# Patient Record
Sex: Female | Born: 2000 | Race: Black or African American | Hispanic: No | Marital: Single | Smoking: Never smoker
Health system: Southern US, Community
[De-identification: ages and names within clinical notes are randomized; demographics above are authoritative.]

## PROBLEM LIST (undated history)

## (undated) ENCOUNTER — Emergency Department (HOSPITAL_COMMUNITY): Admission: EM | Payer: Managed Care, Other (non HMO) | Source: Home / Self Care

## (undated) DIAGNOSIS — K5900 Constipation, unspecified: Secondary | ICD-10-CM

## (undated) DIAGNOSIS — A0472 Enterocolitis due to Clostridium difficile, not specified as recurrent: Secondary | ICD-10-CM

## (undated) HISTORY — DX: Constipation, unspecified: K59.00

## (undated) HISTORY — PX: FECAL TRANSPLANT: SHX6383

## (undated) HISTORY — DX: Enterocolitis due to Clostridium difficile, not specified as recurrent: A04.72

---
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
## 2021-04-12 ENCOUNTER — Other Ambulatory Visit: Payer: Self-pay

## 2021-04-12 ENCOUNTER — Emergency Department (HOSPITAL_COMMUNITY)
Admission: EM | Admit: 2021-04-12 | Discharge: 2021-04-13 | Disposition: A | Discharge status: Home / Self Care | Payer: Managed Care, Other (non HMO) | Attending: Emergency Medicine | Admitting: Emergency Medicine

## 2021-04-12 ENCOUNTER — Encounter (HOSPITAL_COMMUNITY): Payer: Self-pay | Admitting: Emergency Medicine

## 2021-04-12 DIAGNOSIS — R197 Diarrhea, unspecified: Secondary | ICD-10-CM | POA: Insufficient documentation

## 2021-04-12 NOTE — ED Triage Notes (Signed)
Pt c/o abdominal pain and diarrhea, hx cdiff.

## 2021-04-13 ENCOUNTER — Telehealth: Payer: Self-pay | Admitting: Internal Medicine

## 2021-04-13 LAB — URINALYSIS, ROUTINE W REFLEX MICROSCOPIC
Bilirubin Urine: NEGATIVE
Glucose, UA: NEGATIVE mg/dL
Ketones, ur: NEGATIVE mg/dL
Nitrite: NEGATIVE
Protein, ur: NEGATIVE mg/dL
Specific Gravity, Urine: 1.015 (ref 1.005–1.030)
pH: 6 (ref 5.0–8.0)

## 2021-04-13 LAB — LIPASE, BLOOD: Lipase: 36 U/L (ref 11–51)

## 2021-04-13 LAB — COMPREHENSIVE METABOLIC PANEL
ALT: 72 U/L — ABNORMAL HIGH (ref 0–44)
AST: 39 U/L (ref 15–41)
Albumin: 3.3 g/dL — ABNORMAL LOW (ref 3.5–5.0)
Alkaline Phosphatase: 53 U/L (ref 38–126)
Anion gap: 8 (ref 5–15)
BUN: 8 mg/dL (ref 6–20)
CO2: 23 mmol/L (ref 22–32)
Calcium: 8.7 mg/dL — ABNORMAL LOW (ref 8.9–10.3)
Chloride: 106 mmol/L (ref 98–111)
Creatinine, Ser: 1.19 mg/dL — ABNORMAL HIGH (ref 0.44–1.00)
GFR, Estimated: >60 mL/min (ref >60)
Glucose, Bld: 134 mg/dL — ABNORMAL HIGH (ref 70–99)
Potassium: 3.8 mmol/L (ref 3.5–5.1)
Sodium: 137 mmol/L (ref 135–145)
Total Bilirubin: 0.3 mg/dL (ref 0.3–1.2)
Total Protein: 6.9 g/dL (ref 6.5–8.1)

## 2021-04-13 LAB — URINALYSIS, MICROSCOPIC (REFLEX)
Bacteria, UA: NONE SEEN
RBC / HPF: >50 RBC/hpf (ref 0–5)

## 2021-04-13 LAB — CBC
HCT: 35.9 % — ABNORMAL LOW (ref 36.0–46.0)
Hemoglobin: 12 g/dL (ref 12.0–15.0)
MCH: 29.6 pg (ref 26.0–34.0)
MCHC: 33.4 g/dL (ref 30.0–36.0)
MCV: 88.4 fL (ref 80.0–100.0)
Platelets: 300 10*3/uL (ref 150–400)
RBC: 4.06 MIL/uL (ref 3.87–5.11)
RDW: 13.8 % (ref 11.5–15.5)
WBC: 8 10*3/uL (ref 4.0–10.5)
nRBC: 0 % (ref 0.0–0.2)

## 2021-04-13 MED ORDER — SODIUM CHLORIDE 0.9 % IV BOLUS (SEPSIS)
1000.0000 mL | Freq: Once | INTRAVENOUS | Status: AC
  Administered 2021-04-13: 1000 mL via INTRAVENOUS

## 2021-04-13 MED ORDER — SODIUM CHLORIDE 0.9 % IV SOLN: 1000.0000 mL | INTRAVENOUS | Status: DC

## 2021-04-13 MED ORDER — VANCOMYCIN HCL 125 MG PO CAPS
125.0000 mg | ORAL_CAPSULE | Freq: Four times a day (QID) | ORAL | Status: DC
  Filled 2021-04-13 (×3): qty 1

## 2021-04-13 MED ORDER — ONDANSETRON HCL 4 MG/2ML IJ SOLN
4.0000 mg | Freq: Four times a day (QID) | INTRAMUSCULAR | Status: DC | PRN
  Filled 2021-04-13: qty 2

## 2021-04-13 NOTE — Telephone Encounter (Signed)
Received a call to the [facility] call pager regarding this patient. She presents to the ED with diarrhea and mild abdominal pain. She was recently diagnosed with C dif in 12/2020 and completed one course of vancomycin and one course of fidaxomicin. She is able to tolerate PO and states that her ab pain is improved from the symptoms of her prior infection. Has mild AKI. Recommended that she drink oral rehydration solutions at home and asked the ED to get a C dif stool test. If C dif test is positive, then recommend prescribing a prolonged vancomycin taper. I have requested follow up in our clinic for 2 weeks from now

## 2021-04-13 NOTE — Discharge Instructions (Addendum)
-  Take stool sample to lab -We will call you in 24-48 hours if results are positive and send antibiotics to pharmancy -I spoke with Gastroenterologist at [facility] who will contact you and make follow up appointment in the next 2 weeks -Stay hydrated. Drink lots of water and/or Gatorade

## 2021-04-13 NOTE — ED Notes (Signed)
Called for vitals x6 °

## 2021-04-13 NOTE — ED Notes (Signed)
Patient stated that she had not left, and had been in the waiting room. Spoke with charge about same. Charges stated to un discharge her.

## 2021-04-13 NOTE — ED Provider Notes (Signed)
[facility] EMERGENCY DEPARTMENT Provider Note   CSN: [id number] Arrival date & time: 04/12/21  2327     History Chief Complaint  Patient presents with   Diarrhea    Melissa Page is a 20 y.o. female.  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 Pt states she completed 10 days of oral vancomycin as prescribed by urgent care after testing positive for C.diff. Admits to recurrent symptoms on day 11 after completing antibiotic therapy. States she re-presented to urgent care and received 10 days findaxocimin. States she completed antibiotics last week and is having diarrhea again. Denies fevers, chills, nausea, or vomiting.   The history is provided by the patient. No language interpreter was used.  Diarrhea Associated symptoms: no abdominal pain, no arthralgias, no chills, no fever and no vomiting       Past Medical History:  Diagnosis Date   Constipation     Patient Active Problem List   Diagnosis Date Noted   Chronic constipation     Past Surgical History:  Procedure Laterality Date   HYMENECTOMY  06/11/2012   Procedure: HYMENECTOMY;  Surgeon: Lesly Dukes, MD;  Location: [facility];  Service: Gynecology;  Laterality: N/A;     OB History   No obstetric history on file.     Family History  Problem Relation Age of Onset   Asthma Other    Hypertension Other    Hirschsprung's disease Neg Hx     Social History   Tobacco Use   Smoking status: Never   Smokeless tobacco: Never  Substance Use Topics   Alcohol use: No    Comment: pt is 20yo   Drug use: No    Home Medications Prior to Admission medications   Medication Sig Start Date End Date Taking? Authorizing Provider  FIBER SELECT GUMMIES PO Take by mouth.    [provider]  ibuprofen (ADVIL,MOTRIN) 400 MG tablet Take 1 tablet (400 mg total) by mouth every 6 (six) hours as needed (mild pain). 06/11/12   Lesly Dukes, MD   oxyCODONE-acetaminophen (ROXICET) 5-325 MG per tablet Take 1 tablet by mouth every 4 (four) hours as needed for pain. 06/11/12   Lesly Dukes, MD  Prenatal Vit-Fe Fumarate-FA (PRENATAL MULTIVITAMIN) TABS Take 1 tablet by mouth daily. 06/11/12   Lesly Dukes, MD    Allergies    Patient has no known allergies.  Review of Systems   Review of Systems  Constitutional:  Negative for chills and fever.  HENT:  Negative for ear pain and sore throat.   Eyes:  Negative for pain and visual disturbance.  Respiratory:  Negative for cough and shortness of breath.   Cardiovascular:  Negative for chest pain and palpitations.  Gastrointestinal:  Positive for diarrhea. Negative for abdominal pain and vomiting.  Genitourinary:  Negative for dysuria and hematuria.  Musculoskeletal:  Negative for arthralgias and back pain.  Skin:  Negative for color change and rash.  Neurological:  Negative for seizures and syncope.  All other systems reviewed and are negative.  Physical Exam Updated Vital Signs BP (!) 125/94 (BP Location: Left Arm)   Pulse 99   Temp 99 F (37.2 C)   Resp 14   SpO2 99%   Physical Exam Vitals and nursing note reviewed.  Constitutional:      General: She is not in acute distress.    Appearance: She is well-developed.  HENT:  Head: Normocephalic and atraumatic.  Eyes:     Conjunctiva/sclera: Conjunctivae normal.  Cardiovascular:     Rate and Rhythm: Normal rate and regular rhythm.     Heart sounds: No murmur heard. Pulmonary:     Effort: Pulmonary effort is normal. No respiratory distress.     Breath sounds: Normal breath sounds.  Abdominal:     Palpations: Abdomen is soft.     Tenderness: There is no abdominal tenderness.  Musculoskeletal:     Cervical back: Neck supple.  Skin:    General: Skin is warm and dry.  Neurological:     Mental Status: She is alert.    ED Results / Procedures / Treatments   Labs (all labs ordered are listed, but only abnormal  results are displayed) Labs Reviewed  COMPREHENSIVE METABOLIC PANEL - Abnormal; Notable for the following components:      Result Value   Glucose, Bld 134 (*)    Creatinine, Ser 1.19 (*)    Calcium 8.7 (*)    Albumin 3.3 (*)    ALT 72 (*)    All other components within normal limits  CBC - Abnormal; Notable for the following components:   HCT 35.9 (*)    All other components within normal limits  C DIFFICILE QUICK SCREEN W PCR REFLEX    LIPASE, BLOOD  URINALYSIS, ROUTINE W REFLEX MICROSCOPIC  I-STAT BETA HCG BLOOD, ED ([facility], [facility], [facility] ONLY)    EKG None  Radiology No results found.  Procedures Procedures   Medications Ordered in ED Medications  sodium chloride 0.9 % bolus 1,000 mL (has no administration in time range)    Followed by  0.9 %  sodium chloride infusion (has no administration in time range)  ondansetron (ZOFRAN) injection 4 mg (has no administration in time range)  vancomycin (VANCOCIN) capsule 125 mg (has no administration in time range)    ED Course  I have reviewed the triage vital signs and the nursing notes.  Pertinent labs & imaging results that were available during my care of the patient were reviewed by me and considered in my medical decision making (see chart for details).    MDM Rules/Calculators/A&P   11:14 AM 20 yo female presenting for recurrent C.diff infection. Please see HPI for further details. Patient given IV fluids. Stable electrolytes.  I spoke with GI specialist from [facility] who recommends stool PCR prior to treating for C.dfiff. States it is common to have IBS-like symptoms after recovering from C.diff. Pt unable to give sample at this time. Outpatient labs ordered. GI will call patient for follow up patient in the next next 2 weeks. Patient recommended for increased oral hydration during this time.   Patient in no distress and overall condition improved here in the ED. Detailed discussions were had with the patient regarding  current findings, and need for close f/u with PCP or on call doctor. The patient has been instructed to return immediately if the symptoms worsen in any way for re-evaluation. Patient verbalized understanding and is in agreement with current care plan. All questions answered prior to discharge.      Final Clinical Impression(s) / ED Diagnoses Final diagnoses:  None    Rx / DC Orders ED Discharge Orders     None        Franne Forts, DO 04/15/21 1946

## 2021-04-14 ENCOUNTER — Telehealth: Payer: Self-pay

## 2021-04-14 NOTE — Telephone Encounter (Signed)
Letter by Deon Pilling, LPN on 5/46/5035     [facility] [address] [place], [place]  [address] Phone:  [phone]   Fax:  [phone]   April 14, 2021     Melissa Page [address] [place] [address]     Dear Ms. Koller,   We received your records from [facility] to be seen by one of our providers.  We have contacted you via phone and have left a message requesting that you return our call.  Please contact our office at [phone] to schedule your appointment at your earliest convenience.  We look forward to participating in your care.     Sincerely,   [facility] Team  [facility]                       [place], [phone]                           1

## 2021-04-14 NOTE — Telephone Encounter (Signed)
Called pt [phone] (H) to schedule new pt appt per Dr. Derek Mound request. LVM requesting returned call.

## 2021-04-14 NOTE — Telephone Encounter (Signed)
-----   Message from Imogene Burn, MD sent at 04/13/2021 12:41 PM EDT ----- Hi Aarohi Redditt,  Could we get her set up with a new clinic visit with me or any provider in about 2 weeks?  Thanks, Alan Ripper

## 2021-04-17 ENCOUNTER — Ambulatory Visit: Payer: Managed Care, Other (non HMO) | Admitting: Internal Medicine

## 2021-04-17 ENCOUNTER — Encounter: Payer: Self-pay | Admitting: Internal Medicine

## 2021-04-17 ENCOUNTER — Other Ambulatory Visit: Payer: Managed Care, Other (non HMO)

## 2021-04-17 DIAGNOSIS — Z8619 Personal history of other infectious and parasitic diseases: Secondary | ICD-10-CM

## 2021-04-17 DIAGNOSIS — R109 Unspecified abdominal pain: Secondary | ICD-10-CM

## 2021-04-17 DIAGNOSIS — R197 Diarrhea, unspecified: Secondary | ICD-10-CM | POA: Diagnosis not present

## 2021-04-17 MED ORDER — DICYCLOMINE HCL 20 MG PO TABS: 20.0000 mg | ORAL_TABLET | Freq: Four times a day (QID) | ORAL | 1 refills | Status: DC | PRN

## 2021-04-17 NOTE — Patient Instructions (Signed)
If you are age 19 or younger, your body mass index should be between 19-25. Your Body mass index is 27.88 kg/m. If this is out of the aformentioned range listed, please consider follow up with your Primary Care Provider.  __________________________________________________________  The [facility] providers would like to encourage you to use [facility] to communicate with providers for non-urgent requests or questions.  Due to long hold times on the telephone, sending your provider a message by [facility] may be a faster and more efficient way to get a response.  Please allow 48 business hours for a response.  Please remember that this is for non-urgent requests.   Your provider has requested that you go to the basement level for lab work before leaving today. Press "B" on the elevator. The lab is located at the first door on the left as you exit the elevator.  Due to recent changes in healthcare laws, you may see the results of your imaging and laboratory studies on MyChart before your provider has had a chance to review them.  We understand that in some cases there may be results that are confusing or concerning to you. Not all laboratory results come back in the same time frame and the provider may be waiting for multiple results in order to interpret others.  Please give [place] 48 hours in order for your provider to thoroughly review all the results before contacting the office for clarification of your results.   We have sent the following medications to your pharmacy for you to pick up at your convenience:  START: Bentyl 20mg  one tablet four times daily as needed.  Thank you for entrusting me with your care and choosing [facility].  Dr [facility]

## 2021-04-17 NOTE — Progress Notes (Signed)
Chief Complaint: Diarrhea, abdominal pain  HPI : 48 female with history of C dif infection presents with diarrhea and abdominal pain.   She was diagnosed first with C dif back in 12/2020. She took one course of 10 days of vancomycin. After she finished, she started developing diarrhea again. She received another course of 10 days of fidaxomicin. After she completed the course of fidaxomicin, she then developed recurrent diarrhea. Her current diarrhea is much less in volume compared to when she was first diagnosed with C dif. Endorses 5-10 episodes of small episodes of diarrhea per day. Endorses fecal urgency. She first saw a small bright red blood clot two days ago. Otherwise has not seen any hematochezia or melena. She has lost about 7 lbs since 12/2020. Endorses loss of appetite and occasional nausea. Her abdominal pain is located in the center to upper part of her abdomen. The pain comes and goes, and is not consistent throughout the whole day. This pain is also less severe than when she was first diagnosed with C dif. Denies fam hx of GI issues. She has tried ibuprofen for her discomfort, which has not really been effective. She went to the ED for her GI symptoms on 04/13/21.  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  Wt Readings from Last 3 Encounters:  04/17/21 178 lb (80.7 kg) (94 %, Z= 1.59)*  07/13/12 137 lb (62.1 kg) (>99 %, Z= 2.37)*  06/10/12 134 lb 9 oz (61 kg) (>99 %, Z= 2.35)*   * Growth percentiles are based on CDC (Girls, 2-20 Years) data.      Past Medical History:  Diagnosis Date   Constipation      Past Surgical History:  Procedure Laterality Date   HYMENECTOMY  06/11/2012   Procedure: HYMENECTOMY;  Surgeon: Lesly Dukes, MD;  Location: [facility];  Service: Gynecology;  Laterality: N/A;   Family History  Problem Relation Age of Onset   Asthma Other    Hypertension Other    Hirschsprung's disease Neg  Hx    Social History   Tobacco Use   Smoking status: Never   Smokeless tobacco: Never  Vaping Use   Vaping Use: Never used  Substance Use Topics   Alcohol use: No    Comment: pt is 20yo   Drug use: No   Current Outpatient Medications  Medication Sig Dispense Refill   Norethin Ace-Eth Estrad-FE (BLISOVI 24 FE PO) Take 1 mg by mouth daily.     No current facility-administered medications for this visit.   Allergies  Allergen Reactions   Amoxicillin Hives     Review of Systems: All systems reviewed and negative except where noted in HPI.   Physical Exam: BP 110/70   Pulse (!) 111   Ht 5\' 7"  (1.702 m)   Wt 178 lb (80.7 kg)   BMI 27.88 kg/m  Constitutional: Pleasant,well-developed, female in no acute distress. HEENT: Normocephalic and atraumatic. Conjunctivae are normal. No scleral icterus. Cardiovascular: Normal rate, regular rhythm.  Pulmonary/chest: Effort normal and breath sounds normal. No wheezing, rales or rhonchi. Abdominal: Soft, nondistended, nontender. Bowel sounds active throughout. There are no masses palpable. No hepatomegaly. Extremities: no edema Lymphadenopathy: No cervical adenopathy noted. Neurological: Alert and oriented to person place and time. Skin: Skin is warm and dry. No rashes noted. Psychiatric: Normal mood and affect. Behavior is normal.  Labs 03/2021: CBC unremarkable, CMP with mild AKI with Cr 1.19.  Lipase normal. 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 Will plan to check for C dif toxin. If this is negative, then will plan to start an antidiarrheal medication. Will start Bentyl PRN for ab discomfort. - Check GI pathogen panel, O&P - Bentyl 20 mg QID PRN  Eulah Pont, MD

## 2021-04-22 ENCOUNTER — Other Ambulatory Visit: Payer: Managed Care, Other (non HMO)

## 2021-04-22 DIAGNOSIS — R197 Diarrhea, unspecified: Secondary | ICD-10-CM

## 2021-04-22 DIAGNOSIS — Z8619 Personal history of other infectious and parasitic diseases: Secondary | ICD-10-CM

## 2021-04-22 DIAGNOSIS — R109 Unspecified abdominal pain: Secondary | ICD-10-CM

## 2021-04-25 ENCOUNTER — Other Ambulatory Visit: Payer: Self-pay

## 2021-04-25 DIAGNOSIS — A498 Other bacterial infections of unspecified site: Secondary | ICD-10-CM

## 2021-04-25 DIAGNOSIS — A0472 Enterocolitis due to Clostridium difficile, not specified as recurrent: Secondary | ICD-10-CM

## 2021-04-25 DIAGNOSIS — R197 Diarrhea, unspecified: Secondary | ICD-10-CM

## 2021-04-25 LAB — GI PROFILE, STOOL, PCR

## 2021-04-25 LAB — OVA AND PARASITE EXAMINATION
CONCENTRATE RESULT:: NONE SEEN
MICRO NUMBER:: 12428093
SPECIMEN QUALITY:: ADEQUATE
TRICHROME RESULT:: NONE SEEN

## 2021-04-25 MED ORDER — VANCOMYCIN HCL 125 MG PO CAPS: ORAL_CAPSULE | ORAL | 0 refills | Status: AC

## 2021-04-25 MED ORDER — VANCOMYCIN HCL 125 MG PO CAPS: ORAL_CAPSULE | ORAL | 0 refills | Status: DC

## 2021-04-25 NOTE — Progress Notes (Signed)
Hi Ammie, could you please call the patient and let her know that her C dif stool toxin was positive? This suggests that she still has an active C dif infection. Thus, please order her the following vancomycin taper: Vancomycin 125 mg QID for 14 days, then 125 mg BID for 7 days, then 125 mg QD for 7 days, then 125 mg QOD for 14 days.  Please also arrange for her to have a follow up appt with me in 2-3 months. Thank you!

## 2021-06-14 ENCOUNTER — Emergency Department (HOSPITAL_COMMUNITY)
Admission: EM | Admit: 2021-06-14 | Discharge: 2021-06-14 | Disposition: A | Discharge status: Home / Self Care | Payer: Managed Care, Other (non HMO) | Attending: Emergency Medicine | Admitting: Emergency Medicine

## 2021-06-14 DIAGNOSIS — R197 Diarrhea, unspecified: Secondary | ICD-10-CM

## 2021-06-14 DIAGNOSIS — R1084 Generalized abdominal pain: Secondary | ICD-10-CM | POA: Diagnosis present

## 2021-06-14 LAB — CBC WITH DIFFERENTIAL/PLATELET
Abs Immature Granulocytes: 0.03 10*3/uL (ref 0.00–0.07)
Basophils Absolute: 0 10*3/uL (ref 0.0–0.1)
Basophils Relative: 0 %
Eosinophils Absolute: 0.1 10*3/uL (ref 0.0–0.5)
Eosinophils Relative: 1 %
HCT: 36.4 % (ref 36.0–46.0)
Hemoglobin: 12.4 g/dL (ref 12.0–15.0)
Immature Granulocytes: 0 %
Lymphocytes Relative: 29 %
Lymphs Abs: 2.9 10*3/uL (ref 0.7–4.0)
MCH: 29.8 pg (ref 26.0–34.0)
MCHC: 34.1 g/dL (ref 30.0–36.0)
MCV: 87.5 fL (ref 80.0–100.0)
Monocytes Absolute: 0.5 10*3/uL (ref 0.1–1.0)
Monocytes Relative: 5 %
Neutro Abs: 6.4 10*3/uL (ref 1.7–7.7)
Neutrophils Relative %: 65 %
Platelets: 290 10*3/uL (ref 150–400)
RBC: 4.16 MIL/uL (ref 3.87–5.11)
RDW: 13.3 % (ref 11.5–15.5)
WBC: 10 10*3/uL (ref 4.0–10.5)
nRBC: 0 % (ref 0.0–0.2)

## 2021-06-14 LAB — COMPREHENSIVE METABOLIC PANEL
ALT: 32 U/L (ref 0–44)
AST: 24 U/L (ref 15–41)
Albumin: 4.1 g/dL (ref 3.5–5.0)
Alkaline Phosphatase: 54 U/L (ref 38–126)
Anion gap: 7 (ref 5–15)
BUN: 7 mg/dL (ref 6–20)
CO2: 23 mmol/L (ref 22–32)
Calcium: 8.9 mg/dL (ref 8.9–10.3)
Chloride: 105 mmol/L (ref 98–111)
Creatinine, Ser: 0.97 mg/dL (ref 0.44–1.00)
GFR, Estimated: >60 mL/min (ref >60)
Glucose, Bld: 118 mg/dL — ABNORMAL HIGH (ref 70–99)
Potassium: 3.4 mmol/L — ABNORMAL LOW (ref 3.5–5.1)
Sodium: 135 mmol/L (ref 135–145)
Total Bilirubin: 0.5 mg/dL (ref 0.3–1.2)
Total Protein: 7.7 g/dL (ref 6.5–8.1)

## 2021-06-14 LAB — I-STAT BETA HCG BLOOD, ED (MC, WL, AP ONLY): I-stat hCG, quantitative: <5 m[IU]/mL (ref <5)

## 2021-06-14 LAB — LIPASE, BLOOD: Lipase: 30 U/L (ref 11–51)

## 2021-06-14 MED ORDER — GABAPENTIN 300 MG PO CAPS
300.0000 mg | ORAL_CAPSULE | Freq: Once | ORAL | Status: AC
  Administered 2021-06-14: 300 mg via ORAL
  Filled 2021-06-14: qty 1

## 2021-06-14 MED ORDER — VANCOMYCIN HCL 125 MG PO CAPS
125.0000 mg | ORAL_CAPSULE | Freq: Four times a day (QID) | ORAL | Status: DC
  Administered 2021-06-14: 125 mg via ORAL
  Filled 2021-06-14 (×2): qty 1

## 2021-06-14 MED ORDER — GABAPENTIN 300 MG PO CAPS: 300.0000 mg | ORAL_CAPSULE | Freq: Three times a day (TID) | ORAL | 0 refills | Status: DC

## 2021-06-14 MED ORDER — VANCOMYCIN HCL 125 MG PO CAPS: 125.0000 mg | ORAL_CAPSULE | Freq: Four times a day (QID) | ORAL | 0 refills | Status: DC

## 2021-06-14 NOTE — ED Provider Notes (Signed)
[facility]-EMERGENCY DEPT Provider Note   CSN: [id number] Arrival date & time: 06/14/21  1231     History Chief Complaint  Patient presents with   Abdominal Pain    Melissa Page is a 20 y.o. female.  HPI Patient presents with her sister who assists with history. Patient was well until earlier this summer.  About 4 months ago she developed diarrhea.  She was diagnosed with C. difficile.  Since that time she has had episodes of recurrence, and spite of multiple courses of different antibiotics, most recently completion of a course of vancomycin taper.  She states that after she finished this about 3 days ago she was transiently better, but soon thereafter just developed similar pattern of loose stool with great frequency, and crampy abdominal pain that is severe.  No fever, no vomiting.  No relief with she has not yet returned to see her gastroenterologist, as it is the weekend.     Past Medical History:  Diagnosis Date   Constipation     Patient Active Problem List   Diagnosis Date Noted   Chronic constipation     Past Surgical History:  Procedure Laterality Date   HYMENECTOMY  06/11/2012   Procedure: HYMENECTOMY;  Surgeon: Lesly Dukes, MD;  Location: [facility];  Service: Gynecology;  Laterality: N/A;     OB History   No obstetric history on file.     Family History  Problem Relation Age of Onset   Asthma Other    Hypertension Other    Hirschsprung's disease Neg Hx     Social History   Tobacco Use   Smoking status: Never   Smokeless tobacco: Never  Vaping Use   Vaping Use: Never used  Substance Use Topics   Alcohol use: No    Comment: pt is 20yo   Drug use: No    Home Medications Prior to Admission medications   Medication Sig Start Date End Date Taking? Authorizing Provider  BLISOVI FE 1/20 1-20 MG-MCG tablet Take 1 tablet by mouth daily.   Yes [provider]  gabapentin (NEURONTIN) 300 MG capsule Take 1 capsule (300  mg total) by mouth 3 (three) times daily. 06/14/21  Yes Gerhard Munch, MD  vancomycin (VANCOCIN) 125 MG capsule Take 1 capsule (125 mg total) by mouth 4 (four) times daily for 7 days. 06/14/21 06/21/21 Yes Gerhard Munch, MD  dicyclomine (BENTYL) 20 MG tablet Take 1 tablet (20 mg total) by mouth 4 (four) times daily as needed for spasms. Patient not taking: Reported on 06/14/2021 04/17/21   Imogene Burn, MD    Allergies    Amoxicillin  Review of Systems   Review of Systems  Constitutional:        Per HPI, otherwise negative  HENT:         Per HPI, otherwise negative  Respiratory:         Per HPI, otherwise negative  Cardiovascular:        Per HPI, otherwise negative  Gastrointestinal:  Positive for abdominal pain and diarrhea. Negative for vomiting.  Endocrine:       Negative aside from HPI  Genitourinary:        Neg aside from HPI   Musculoskeletal:        Per HPI, otherwise negative  Skin: Negative.   Neurological:  Negative for syncope.   Physical Exam Updated Vital Signs BP 135/88   Pulse 97   Temp 98.7 F (37.1 C) (Oral)  Resp 18   Ht 5\' 7"  (1.702 m)   Wt 81.2 kg   LMP 05/15/2021   SpO2 99%   BMI 28.04 kg/m   Physical Exam Vitals and nursing note reviewed.  Constitutional:      General: She is not in acute distress.    Appearance: She is well-developed.  HENT:     Head: Normocephalic and atraumatic.  Eyes:     Conjunctiva/sclera: Conjunctivae normal.  Cardiovascular:     Rate and Rhythm: Normal rate and regular rhythm.  Pulmonary:     Effort: Pulmonary effort is normal. No respiratory distress.     Breath sounds: Normal breath sounds. No stridor.  Abdominal:     General: There is no distension.     Tenderness: There is generalized abdominal tenderness. There is no guarding.  Skin:    General: Skin is warm and dry.  Neurological:     Mental Status: She is alert and oriented to person, place, and time.     Cranial Nerves: No cranial nerve  deficit.    ED Results / Procedures / Treatments   Labs (all labs ordered are listed, but only abnormal results are displayed) Labs Reviewed  COMPREHENSIVE METABOLIC PANEL - Abnormal; Notable for the following components:      Result Value   Potassium 3.4 (*)    Glucose, Bld 118 (*)    All other components within normal limits  C DIFFICILE QUICK SCREEN W PCR REFLEX    GASTROINTESTINAL PANEL BY PCR, STOOL (REPLACES STOOL CULTURE)  LIPASE, BLOOD  CBC WITH DIFFERENTIAL/PLATELET  I-STAT BETA HCG BLOOD, ED ([facility], [facility], [facility] ONLY)    EKG None  Radiology No results found.  Procedures Procedures   Medications Ordered in ED Medications  gabapentin (NEURONTIN) capsule 300 mg (has no administration in time range)  vancomycin (VANCOCIN) capsule 125 mg (has no administration in time range)    ED Course  I have reviewed the triage vital signs and the nursing notes.  Pertinent labs & imaging results that were available during my care of the patient were reviewed by me and considered in my medical decision making (see chart for details).  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   10:41 PM Labs reviewed, discussed, possibilities including recurrent C. difficile, postinfection inflammation, inflammatory changes all considered.  Here no evidence for bacteremia, sepsis, she has a soft, nonperitoneal exam though she does have some tenderness to palpation. We discussed possibilities as above, options for therapy including reinitiation of vancomycin pending outpatient GI follow-up with new analgesia regimen.  Patient has a preference of this.  And I also discussed imaging, given the absence of peritonitis, young age, generally reassuring labs, advanced imaging not currently indicated. MDM Rules/Calculators/A&P MDM Number of Diagnoses or Management Options Diarrhea of presumed infectious origin:  established, worsening   Amount and/or Complexity of Data Reviewed Clinical lab tests: ordered and reviewed Tests in the radiology section of CPT: ordered and reviewed Tests in the medicine section of CPT: reviewed and ordered Decide to obtain previous medical records or to obtain history from someone other than the patient: yes Obtain history from someone other than the patient: yes Review and summarize past medical records: yes Independent visualization of images, tracings, or specimens: yes  Risk of Complications, Morbidity, and/or Mortality Presenting problems: high Diagnostic procedures: high Management options: high  Critical Care Total time providing critical care: < 30 minutes  Patient Progress Patient progress: stable   Final Clinical Impression(s) / ED Diagnoses Final diagnoses:  Diarrhea of presumed infectious origin    Rx / DC Orders ED Discharge Orders          Ordered    gabapentin (NEURONTIN) 300 MG capsule  3 times daily        06/14/21 2239    vancomycin (VANCOCIN) 125 MG capsule  4 times daily        06/14/21 2239             Gerhard Munch, MD 06/14/21 2242

## 2021-06-14 NOTE — Discharge Instructions (Signed)
As discussed, you are restarting antibiotics as though your C. difficile requires additional treatment.  Other possibilities including refractory diarrhea or entities such as IBS should be considered and can be discussed with your physician when you see her this week.  In the short-term please take all medication as directed, monitor your condition carefully and do not hesitate to return here if you develop new, or concerning changes in your condition.

## 2021-06-14 NOTE — ED Provider Notes (Signed)
Emergency Medicine Provider Triage Evaluation Note  Melissa Page , a 20 y.o. female  was evaluated in triage.  Pt complains of concern for recurrent diarrhea and c diff.  She was on antibiotics until 1.5 weeks ago when she finished. She finished the antibiotics on the 10th and her symptoms cam e back on the 12th or 13th.  No fevers.  Her pain is middle around the umbilicus.  Feels nausea with out vomiting.   Review of Systems  Positive: Diarrhea, abdominal pain Negative: fevers  Physical Exam  BP 130/86 (BP Location: Left Arm)   Pulse 98   Temp 99.3 F (37.4 C) (Oral)   Resp 16   Ht 5\' 7"  (1.702 m)   Wt 81.2 kg   LMP 05/15/2021   SpO2 95%   BMI 28.04 kg/m  Gen:   Awake, no distress   Resp:  Normal effort  MSK:   Moves extremities without difficulty  Other:  Normal speech  Medical Decision Making  Medically screening exam initiated at 8:12 PM.  Appropriate orders placed.  Pascale Alfred was informed that the remainder of the evaluation will be completed by another provider, this initial triage assessment does not replace that evaluation, and the importance of remaining in the ED until their evaluation is complete.  Patient aware of need for stool sample.  Labs ordered.   Ofilia Neas 06/14/21 2017    2018, MD 06/14/21 2240

## 2021-06-14 NOTE — ED Triage Notes (Signed)
Patient reports she was diagnosed with cdiff in June, took antibiotics. Symptoms returned, she went back to pcp, given another course of antibiotics. Patient says symptoms returned again (abd pain, cramping) - patient went to GI specialist, diagnosed with CDIFF again, was on abx for 4 weeks. Says she is always okay when on abx, finished them 1.5 weeks ago and symptoms have returned. Has GI appt next Tuesday. Pain rated 10/10

## 2021-06-17 ENCOUNTER — Ambulatory Visit (INDEPENDENT_AMBULATORY_CARE_PROVIDER_SITE_OTHER): Payer: Managed Care, Other (non HMO) | Admitting: Internal Medicine

## 2021-06-17 ENCOUNTER — Other Ambulatory Visit: Payer: Managed Care, Other (non HMO)

## 2021-06-17 ENCOUNTER — Encounter: Payer: Self-pay | Admitting: Internal Medicine

## 2021-06-17 VITALS — BP 132/78 | HR 64 | Ht 67.0 in | Wt 184.0 lb

## 2021-06-17 DIAGNOSIS — R197 Diarrhea, unspecified: Secondary | ICD-10-CM | POA: Diagnosis not present

## 2021-06-17 DIAGNOSIS — A0471 Enterocolitis due to Clostridium difficile, recurrent: Secondary | ICD-10-CM

## 2021-06-17 MED ORDER — VANCOMYCIN HCL 125 MG PO CAPS: ORAL_CAPSULE | ORAL | 0 refills | Status: AC

## 2021-06-17 NOTE — Progress Notes (Addendum)
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 This manifested as an increased in her BMs from once a day to 5-10 times per day. Her diarrhea comes with abdominal discomfort. She tried to wait until her follow up appointment with me, but she went to the ED on 11/19 due to the severity of her symptoms. At that time she was instructed to restart vancomycin.   Past Medical History:  Diagnosis Date   C. difficile colitis    Constipation      Past Surgical History:  Procedure Laterality Date   HYMENECTOMY  06/11/2012   Procedure: HYMENECTOMY;  Surgeon: Guss Bunde, MD;  Location: [facility];  Service: Gynecology;  Laterality: N/A;   Family History  Problem Relation Age of Onset   Asthma Other    Hypertension Other    Hirschsprung's disease Neg Hx    Social History   Tobacco Use   Smoking status: Never   Smokeless tobacco: Never  Vaping Use   Vaping Use: Never used  Substance Use Topics   Alcohol use: No    Comment: pt is 20yo   Drug use: No   Current Outpatient Medications  Medication Sig Dispense Refill   BLISOVI FE 1/20 1-20 MG-MCG tablet Take 1 tablet by mouth daily.     gabapentin (NEURONTIN) 300 MG capsule Take 1 capsule (300 mg total) by mouth 3 (three) times daily. 30 capsule 0   vancomycin (VANCOCIN) 125 MG capsule Take 1 capsule (125 mg total) by mouth 4 (four) times daily for 7 days. 28 capsule 0   No current facility-administered medications for this visit.   Allergies  Allergen Reactions   Amoxicillin Hives   Review of Systems: All systems reviewed and negative except where noted in HPI.   Physical Exam: BP 132/78   Pulse 64   Ht 5\' 7"  (1.702 m)   Wt 184 lb (83.5 kg)   BMI 28.82 kg/m  Constitutional: Pleasant,well-developed, female in no acute distress. HEENT:  Normocephalic and atraumatic. Conjunctivae are normal. No scleral icterus. Cardiovascular: Normal rate, regular rhythm.  Pulmonary/chest: Effort normal and breath sounds normal. No wheezing, rales or rhonchi. Abdominal: Soft, nondistended, nontender. Bowel sounds active throughout. There are no masses palpable. No hepatomegaly. Extremities: no edema Lymphadenopathy: No cervical adenopathy noted. Neurological: Alert and oriented to person place and time. Skin: Skin is warm and dry. No rashes noted. Psychiatric: Normal mood and affect. Behavior is normal.  Labs 03/2021: CBC unremarkable, CMP with mild AKI with Cr 1.19. Lipase normal. U/A with small leukocytes and large Hb. C dif toxin was detected. 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 Prior to her recent prolonged vancomycin taper, she has already been treated with 10 day courses of fidaxomicin and vancomycin in the past.  I suspect at this point that she will likely need FMT, and may benefit from bezlotoxumab if it is able to be approved. In the meantime will also confirm once again that she truly has another recurrence of her C dif and check a flex sig to see if there are any alternative reason for her diarrhea. - Check GI path panel and C dif  PCR. If recurrent C dif is confirmed, then will need to refer to [facility] or [facility] for FMT - Will continue her vancomycin and put her on another prolonged taper (vanc 125 mg QID for 2 weeks, 125 mg BID for 2 weeks, and 125 mg QD for 2 weeks). If she is not able to get FMT, she may need suppressive long term vancomycin - Flex sigmoidoscopy [facility] due to rapid recurrence of symptoms - ID referral. Consider bezlotoxumab.  Eulah Pont, MD

## 2021-06-17 NOTE — Patient Instructions (Signed)
If you are age 19 or older, your body mass index should be between 23-30. Your Body mass index is 28.82 kg/m. If this is out of the aforementioned range listed, please consider follow up with your Primary Care Provider.  If you are age 70 or younger, your body mass index should be between 19-25. Your Body mass index is 28.82 kg/m. If this is out of the aformentioned range listed, please consider follow up with your Primary Care Provider.   You have been scheduled for a flexible sigmoidoscopy. Please follow the written instructions given to you at your visit today. If you use inhalers (even only as needed), please bring them with you on the day of your procedure.  Your provider has requested that you go to the basement level for lab work before leaving today. Press "B" on the elevator. The lab is located at the first door on the left as you exit the elevator.  The [facility] providers would like to encourage you to use [facility] to communicate with providers for non-urgent requests or questions.  Due to long hold times on the telephone, sending your provider a message by [facility] may be a faster and more efficient way to get a response.  Please allow 48 business hours for a response.  Please remember that this is for non-urgent requests.   It was a pleasure to see you today!  Thank you for trusting me with your gastrointestinal care!    [name], MD

## 2021-06-23 ENCOUNTER — Other Ambulatory Visit: Payer: Self-pay

## 2021-06-23 ENCOUNTER — Other Ambulatory Visit: Payer: Self-pay | Admitting: Internal Medicine

## 2021-06-23 ENCOUNTER — Ambulatory Visit (INDEPENDENT_AMBULATORY_CARE_PROVIDER_SITE_OTHER): Payer: Managed Care, Other (non HMO) | Admitting: Internal Medicine

## 2021-06-23 ENCOUNTER — Encounter: Payer: Self-pay | Admitting: Internal Medicine

## 2021-06-23 VITALS — BP 137/82 | HR 107 | Temp 98.3°F | Resp 16 | Ht 67.0 in | Wt 181.0 lb

## 2021-06-23 DIAGNOSIS — A0472 Enterocolitis due to Clostridium difficile, not specified as recurrent: Secondary | ICD-10-CM | POA: Diagnosis not present

## 2021-06-23 DIAGNOSIS — A0471 Enterocolitis due to Clostridium difficile, recurrent: Secondary | ICD-10-CM

## 2021-06-23 NOTE — Progress Notes (Signed)
[facility]  Reason for Consult:recurrent cdiff  Referring Provider: [facility]    Patient Active Problem List   Diagnosis Date Noted   Chronic constipation       HPI: Melissa Page is a 20 y.o. female no previous medical problem referred here by [facility] physicians for recurrent cdiff  Chart/labs reviewed on epic (although I don't have notes from 01/2021 urgent care visit)  Patient has had 2 discrete episodes where she have been tested, however, she "seems to have more than 2 episodes" as she was given 4 courses of abx.  The first episode was early to mid 01/2021 -- diarrhea (nonbloody, up to 3-4 times a day), abd pain, occasional nausea, bloating. Went to [facility] urgent care has stool testing (again no result for me to review to indicate which assay) and was told to have cdiff. She was given PO vancomycin 10 days. While she was taking that she had resolution of sx. But when she stopped for 2 days, the diarrhea frequency got worse and some abd pain; diarrhea 5-6 times a day. She was given dificid for 10 days. This helped while she was on it but then diarrhea recurs a few days after she stopped the dificid.   She saw the [place] ED again 9/18 for severe abd pain/9-10 times a day diarrhea. Cdiff stool toxin pcr was positive. She was referred to [facility]. She was given a tapered course of vancomycin 4 weeks finished in early 05/2021. The diarrhea got better during the qid stage pretty much immediately after starting to take it. When she was on qod stage the stool was soft, and became 9-10 diarrhea again once stopped. No leukocytosis on cbc  She restarted her most recent vancomycin again since 11/19; she finished 7 days of po qid vanc. GI doc is planning on tapered course.   Currenty no sx. Eating well No f/c No prior episode diarrhea before this  Didn't recall taking abx or acid blocker prior to her first episode of cdiff. 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 Sx started 2-3 weeks after coming back from [place]  Family hx: No diarrhea/gi illness    Review of Systems: ROS All other ROS negative.      Past Medical History:  Diagnosis Date   C. difficile colitis    Constipation     Social History   Tobacco Use   Smoking status: Never   Smokeless tobacco: Never  Vaping Use   Vaping Use: Never used  Substance Use Topics   Alcohol use: No    Comment: pt is 20yo   Drug use: No    Family History  Problem Relation Age of Onset   Asthma Other    Hypertension Other    Hirschsprung's disease Neg Hx     Allergies  Allergen Reactions   Amoxicillin Hives    OBJECTIVE: Vitals:   06/23/21 1041  Resp: 16  Weight: 181 lb (82.1 kg)  Height: 5\' 7"  (1.702 m)   Body mass index is 28.35 kg/m.   Physical Exam General/constitutional: no distress, pleasant HEENT: Normocephalic, PER, Conj Clear, EOMI, Oropharynx clear Neck supple CV: rrr no mrg Lungs: clear to auscultation, normal respiratory effort Abd: Soft, Nontender Ext: no edema Skin: No Rash Neuro: nonfocal MSK: no peripheral joint swelling/tenderness/warmth;  back spines nontender   Lab: Lab Results  Component Value Date   WBC 10.0 06/14/2021   HGB 12.4 06/14/2021   HCT 36.4 06/14/2021   MCV 87.5 06/14/2021   PLT 290 [id number]   Last metabolic panel Lab Results  Component Value Date   GLUCOSE 118 (H) 06/14/2021   NA 135 06/14/2021   K 3.4 (L) 06/14/2021   CL 105 06/14/2021   CO2 23 06/14/2021   BUN 7 06/14/2021   CREATININE 0.97 06/14/2021   GFRNONAA >60 06/14/2021   CALCIUM 8.9 06/14/2021   PROT 7.7 06/14/2021   ALBUMIN 4.1 06/14/2021   BILITOT 0.5 06/14/2021   ALKPHOS 54 06/14/2021   AST 24 06/14/2021   ALT 32 06/14/2021   ANIONGAP 7 06/14/2021     Microbiology:  Serology:  Imaging:   Assessment/plan: Problem List Items Addressed This Visit   None Visit Diagnoses     C. difficile colitis    -  Primary       I am surprised patient without any risk would have persistent/recurrent cdiff We reviewed options for treatment of recurrent cdiff including FMT and also reviewed data on fidaxomycin and bezlotuxamab  I worry that she wouldn't be a good candidate or someone that would benefit from bezlotuxamab, as subgroup analysis indicate no benefit for those treated with fidaxomycin (without improvement in recurrence) and also certain types of cdiff strain (which we do not have luxury of knowing what strain she have)  I agree that her sx suggest infection persistence/recurrence rather than IBS post gastroenteritis  At this time, I agree going on a pulse steroid and stay there (every other day) until she could get FMT. I am not against given her Bezlo though if her GI doctor feels strongly about   -ok to try bezlo but would suggest getting referal for FMT -f/u as needed if further assistance from ID needed      Follow-up: Return if symptoms worsen or fail to improve.  Jabier Mutton, [place] for [place] [phone] pager   [phone] cell 06/23/2021, 10:42 AM

## 2021-06-23 NOTE — Progress Notes (Signed)
Received note from her ID visit today. Placed referral to [facility] with Dr. Carolyn Stare for consideration of FMT. Called the patient and let her know that I was placing the referral.

## 2021-06-23 NOTE — Patient Instructions (Signed)
I would consider proceeding to stool transplant at this time. If your GI doctor wants to try the bezlotuxomab infusion, it is reasonable to do so, although I worry it might not be effective (we reviewed the data)

## 2021-06-27 ENCOUNTER — Telehealth: Payer: Self-pay | Admitting: Internal Medicine

## 2021-06-27 NOTE — Telephone Encounter (Signed)
Called the patient to stop her oral vancomycin 48 hours before her fecal transplant procedure.  Patient understands and agrees to do so. Discussed her case with Dr. Carolyn Stare with [facility] who will arrange for direct colonoscopy with FMT.

## 2021-07-02 NOTE — Telephone Encounter (Signed)
Patient called asking if she would still need to proceed with the scheduled Flex procedure with Dr. Orvan Falconer or is she just to have the procedure with [facility] please call her back to advise.

## 2021-07-02 NOTE — Telephone Encounter (Signed)
Per Dr. Leonides Schanz, Flex Sig has been canceled. Called pt and LVM asking that she call me back.

## 2021-07-03 NOTE — Telephone Encounter (Signed)
SECOND ATTEMPT:  Advised pt of Dr. Derek Mound response below. Verbalized acceptance and understanding.

## 2021-07-09 ENCOUNTER — Ambulatory Visit: Payer: Managed Care, Other (non HMO) | Admitting: Internal Medicine

## 2021-07-10 ENCOUNTER — Other Ambulatory Visit: Payer: Managed Care, Other (non HMO) | Admitting: Internal Medicine

## 2021-07-23 ENCOUNTER — Telehealth: Payer: Self-pay

## 2021-07-23 NOTE — Telephone Encounter (Signed)
-----   Message from Imogene Burn, MD sent at 07/23/2021 11:02 AM EST ----- Hi Rudell Ortman, let's bring Ms. Whitehorn back to clinic in 1 month to check in and see how she is doing after her FMT.  Thanks, Alan Ripper

## 2021-07-23 NOTE — Telephone Encounter (Signed)
Per Dr. Derek Mound request, called pt to schedule 1 mo f/u; s/p FMT 07/15/21. LVM requesting returned call.

## 2021-07-24 NOTE — Telephone Encounter (Signed)
SECOND ATTEMPT: ° °LVM requesting returned call. °

## 2021-07-24 NOTE — Telephone Encounter (Signed)
Pt returned call and has been scheduled as follows:  Appointment Information  Name: Anisten, Tomassi MRN: [id number]  Date: 09/02/2021 Status: Sch  Time: 8:30 AM Length: 20  Visit Type: FOLLOW UP 20 [336] Copay: $100.00  Provider: Imogene Burn, MD Department: [facility]  Referring Provider: Shon Hale CSN: [id number]  Notes: 57mo f/u FMT/ae  Made On: 07/24/2021 8:55 AM By: Peterson Ao, Haydin Dunn J

## 2021-09-02 ENCOUNTER — Encounter: Payer: Self-pay | Admitting: Internal Medicine

## 2021-09-02 ENCOUNTER — Ambulatory Visit (INDEPENDENT_AMBULATORY_CARE_PROVIDER_SITE_OTHER): Payer: Managed Care, Other (non HMO) | Admitting: Internal Medicine

## 2021-09-02 VITALS — BP 116/76 | HR 102 | Ht 67.0 in | Wt 182.5 lb

## 2021-09-02 DIAGNOSIS — A0471 Enterocolitis due to Clostridium difficile, recurrent: Secondary | ICD-10-CM

## 2021-09-02 NOTE — Patient Instructions (Signed)
If you are age 20 or older, your body mass index should be between 23-30. Your Body mass index is 28.58 kg/m. If this is out of the aforementioned range listed, please consider follow up with your Primary Care Provider.  If you are age 84 or younger, your body mass index should be between 19-25. Your Body mass index is 28.58 kg/m. If this is out of the aformentioned range listed, please consider follow up with your Primary Care Provider.   ________________________________________________________  The [facility] providers would like to encourage you to use [facility] to communicate with providers for non-urgent requests or questions.  Due to long hold times on the telephone, sending your provider a message by [facility] may be a faster and more efficient way to get a response.  Please allow 48 business hours for a response.  Please remember that this is for non-urgent requests.  _______________________________________________________  Melissa Page you are feeling better!  Please follow up as needed

## 2021-09-02 NOTE — Progress Notes (Signed)
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 She felt gassy a few days after her colonoscopy but this eventually resolved, and she otherwise has been well. Denies diarrhea, ab pain, N&V. She is on average having 1 BM per day.  She last took her vancomycin two days before her FMT procedure. 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 30 capsule 0   SPRINTEC 28 0.25-35 MG-MCG tablet Take 1 tablet by mouth daily.     No current facility-administered medications for this visit.   Review of Systems: All systems reviewed and negative except where noted in HPI.   Physical Exam: BP 116/76    Pulse (!) 102    Ht 5\' 7"  (1.702 m)    Wt 182 lb 8 oz (82.8 kg)    SpO2 98%    BMI 28.58 kg/m  Constitutional: Pleasant,well-developed, female in no acute distress. HEENT: Normocephalic and atraumatic. Conjunctivae are normal. No scleral icterus. Cardiovascular: Normal rate, regular rhythm.  Pulmonary/chest: Effort normal and breath sounds normal. No wheezing, rales or rhonchi. Abdominal: Soft, nondistended, nontender.  Extremities: no edema Lymphadenopathy: No cervical adenopathy noted. Neurological: Alert and oriented to person place and time. Skin: Skin is warm and dry. No rashes noted. Psychiatric: Normal mood and affect. Behavior is normal.  Labs 03/2021: CBC unremarkable, CMP with mild AKI with Cr 1.19. Lipase normal. U/A with small leukocytes and large Hb. C dif toxin was detected. O&P was negative  Colonoscopy 07/15/21: Findings: The terminal ileum appeared normal. The colon appeared grossly normal on limited  inspection during intubation to the cecum. The decision was made to proceed  with fecal microbiota  transplant (bacteriotherapy). Donor stool was supplied  by [employer] (purchased frozen stool), Donor/Unit ID #  [id number], exp 05/08/2022, and prepared using saline as  per protocol. Approximately 250 mL of the donor stool  was instilled in the cecum. A detailed colonoscopic  exam could not be performed upon scope withdrawal  secondary to limited visibility from the instilled  stool. This precludes the ability to screen for colon  cancer, and the patient was made aware of this prior  to the procedure.. Impression: - Preparation of the colon was fair. - The examined portion of the ileum was normal. - Fecal Microbiota Transplant (Bacteriotherapy)  performed in the cecum. - No specimens collected.  ASSESSMENT AND PLAN:  Recurrent C dif s/p FMT Patient has had resolution in her diarrheal symptoms.  Has been doing well after FMT.  No signs of recurrent C. difficile or postinfectious IBS.  No GI complaints today. - RTC PRN  05/10/2022, MD

## 2023-02-04 ENCOUNTER — Other Ambulatory Visit: Payer: Self-pay | Admitting: Oncology

## 2023-02-04 DIAGNOSIS — Z006 Encounter for examination for normal comparison and control in clinical research program: Secondary | ICD-10-CM

## 2023-02-12 ENCOUNTER — Ambulatory Visit: Payer: Managed Care, Other (non HMO) | Admitting: Allergy

## 2023-03-23 ENCOUNTER — Other Ambulatory Visit: Payer: Self-pay | Admitting: Family Medicine

## 2023-03-23 ENCOUNTER — Ambulatory Visit: Payer: Managed Care, Other (non HMO) | Admitting: Allergy & Immunology

## 2023-03-23 DIAGNOSIS — N6315 Unspecified lump in the right breast, overlapping quadrants: Secondary | ICD-10-CM

## 2023-04-02 ENCOUNTER — Ambulatory Visit
Admission: RE | Admit: 2023-04-02 | Discharge: 2023-04-02 | Disposition: A | Discharge status: Home / Self Care | Payer: Managed Care, Other (non HMO) | Source: Ambulatory Visit | Attending: Family Medicine | Admitting: Family Medicine

## 2023-04-02 DIAGNOSIS — N6315 Unspecified lump in the right breast, overlapping quadrants: Secondary | ICD-10-CM

## 2023-04-05 ENCOUNTER — Other Ambulatory Visit: Payer: Self-pay | Admitting: Family Medicine

## 2023-04-05 DIAGNOSIS — N6001 Solitary cyst of right breast: Secondary | ICD-10-CM

## 2023-11-24 ENCOUNTER — Other Ambulatory Visit

## 2024-01-12 ENCOUNTER — Encounter (INDEPENDENT_AMBULATORY_CARE_PROVIDER_SITE_OTHER): Payer: Self-pay

## 2024-03-23 ENCOUNTER — Encounter (INDEPENDENT_AMBULATORY_CARE_PROVIDER_SITE_OTHER): Payer: Self-pay | Admitting: Otolaryngology

## 2024-03-23 ENCOUNTER — Ambulatory Visit (INDEPENDENT_AMBULATORY_CARE_PROVIDER_SITE_OTHER): Admitting: Otolaryngology

## 2024-03-23 VITALS — BP 138/91 | HR 88

## 2024-03-23 DIAGNOSIS — J3489 Other specified disorders of nose and nasal sinuses: Secondary | ICD-10-CM

## 2024-03-23 DIAGNOSIS — J342 Deviated nasal septum: Secondary | ICD-10-CM

## 2024-03-23 DIAGNOSIS — R0683 Snoring: Secondary | ICD-10-CM

## 2024-03-23 DIAGNOSIS — R065 Mouth breathing: Secondary | ICD-10-CM

## 2024-03-23 DIAGNOSIS — J3089 Other allergic rhinitis: Secondary | ICD-10-CM

## 2024-03-23 DIAGNOSIS — J343 Hypertrophy of nasal turbinates: Secondary | ICD-10-CM | POA: Diagnosis not present

## 2024-03-23 DIAGNOSIS — G4733 Obstructive sleep apnea (adult) (pediatric): Secondary | ICD-10-CM

## 2024-03-23 DIAGNOSIS — R0981 Nasal congestion: Secondary | ICD-10-CM | POA: Diagnosis not present

## 2024-03-23 MED ORDER — FLUTICASONE PROPIONATE 50 MCG/ACT NA SUSP: 2.0000 | Freq: Every day | NASAL | 6 refills | Status: DC

## 2024-03-23 MED ORDER — LEVOCETIRIZINE DIHYDROCHLORIDE 5 MG PO TABS: 5.0000 mg | ORAL_TABLET | Freq: Every evening | ORAL | 3 refills | Status: DC

## 2024-03-23 NOTE — Progress Notes (Signed)
 ENT CONSULT:  Reason for Consult: snoring and chronic nasal congestion mouth breathing  HPI: Discussed the use of AI scribe software for clinical note transcription with the patient, who gave verbal consent to proceed.  History of Present Illness Melissa Page is a 23 year old female who presents with snoring and chronic nasal congestion.  She has a history of snoring that began in childhood, around the age of 30 or [age]. At that time, she was evaluated by an ENT specialist and was prescribed a nasal spray, which she used for about a year and a half. This treatment resolved her symptoms for several years.  In the past couple of years, she has experienced a recurrence of symptoms, including difficulty breathing through her nose and increased snoring. She describes herself as an 'avid mouth breather' due to discomfort when breathing through her nose. She is not currently using any medication for nasal congestion.  She has not been tested for allergies in the past. Recently, she has started experiencing reflux, particularly after consuming spicy foods, but she is not on any medication for this issue.   Records Reviewed:  GI note from 09/02/21 Dr Federico  Interval History: Doing well after her FMT about 1.5 months ago. She felt gassy a few days after her colonoscopy but this eventually resolved, and she otherwise has been well. Denies diarrhea, ab pain, N&V. She is on average having 1 BM per day.  She last took her vancomycin  two days before her FMT procedure. 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  Has been doing well after FMT.  No signs of recurrent C. difficile or postinfectious IBS.  No GI complaints today. - RTC PRN   Past Medical History:  Diagnosis Date   C. difficile colitis    Constipation     Past Surgical History:  Procedure Laterality Date   FECAL TRANSPLANT     HYMENECTOMY   06/11/2012   Procedure: HYMENECTOMY;  Surgeon: Burnard VEAR Pate, MD;  Location: [facility];  Service: Gynecology;  Laterality: N/A;    Family History  Problem Relation Age of Onset   Asthma Other    Hypertension Other    Hirschsprung's disease Neg Hx    Stomach cancer Neg Hx    Pancreatic cancer Neg Hx    Colon cancer Neg Hx    Esophageal cancer Neg Hx     Social History:  reports that she has never smoked. She has never used smokeless tobacco. She reports that she does not drink alcohol and does not use drugs.  Allergies:  Allergies  Allergen Reactions   Amoxicillin Hives    Medications: I have reviewed the patient's current medications.  The PMH, PSH, Medications, Allergies, and SH were reviewed and updated.  ROS: Constitutional: Negative for fever, weight loss and weight gain. Cardiovascular: Negative for chest pain and dyspnea on exertion. Respiratory: Is not experiencing shortness of breath at rest. Gastrointestinal: Negative for nausea and vomiting. Neurological: Negative for headaches. Psychiatric: The patient is not nervous/anxious  Blood pressure (!) 138/91, pulse 88, SpO2 98%. There is no height or weight on file to calculate BMI.  PHYSICAL EXAM:  Exam: General: Well-developed, well-nourished Respiratory Respiratory effort: Equal inspiration and expiration without stridor Cardiovascular Peripheral Vascular: Warm extremities with equal color/perfusion Eyes: No nystagmus with equal extraocular motion bilaterally Neuro/Psych/Balance: Patient oriented to person, place, and time; Appropriate mood and affect; Gait  is intact with no imbalance; Cranial nerves I-XII are intact Head and Face Inspection: Normocephalic and atraumatic without mass or lesion Palpation: Facial skeleton intact without bony stepoffs Salivary Glands: No mass or tenderness Facial Strength: Facial motility symmetric and full bilaterally ENT Pinna: External ear intact and fully  developed External canal: Canal is patent with intact skin Tympanic Membrane: Clear and mobile External Nose: No scar or anatomic deformity Internal Nose: Septum is deviated to the left. No polyp, or purulence. Mucosal edema and erythema present.  Bilateral inferior turbinate hypertrophy.  Lips, Teeth, and gums: Mucosa and teeth intact and viable TMJ: No pain to palpation with full mobility Oral cavity/oropharynx: No erythema or exudate, no lesions present Nasopharynx: No mass or lesion with intact mucosa Neck and Trachea: Midline trachea without mass or lesion Thyroid: No mass or nodularity Lymphatics: No lymphadenopathy  Procedure:   PROCEDURE NOTE: nasal endoscopy  Preoperative diagnosis: chronic nasal congestion/obstruction symptoms  Postoperative diagnosis: same  Procedure: Diagnostic nasal endoscopy (68768)  Surgeon: Elena Larry, M.D.  Anesthesia: Topical lidocaine  and Afrin  H&P REVIEW: The patient's history and physical were reviewed today prior to procedure. All medications were reviewed and updated as well. Complications: None Condition is stable throughout exam Indications and consent: The patient presents with symptoms of chronic sinusitis not responding to previous therapies. All the risks, benefits, and potential complications were reviewed with the patient preoperatively and informed consent was obtained. The time out was completed with confirmation of the correct procedure.   Procedure: The patient was seated upright in the clinic. Topical lidocaine  and Afrin were applied to the nasal cavity. After adequate anesthesia had occurred, the rigid nasal endoscope was passed into the nasal cavity. The nasal mucosa, turbinates, septum, and sinus drainage pathways were visualized bilaterally. This revealed no purulence or significant secretions that might be cultured. There were no polyps or sites of significant inflammation. The mucosa was intact and there was no  crusting present. The scope was then slowly withdrawn and the patient tolerated the procedure well. There were no complications or blood loss.   Studies Reviewed: CXR 03/04/2006 Findings: Cardiothymic silhouette is within normal limits for size and contour.  There is continued interstitial prominence and possible ground glass attenuation as well.  No pleural fluid.  IMPRESSION:  Persistent interstitial prominence and possible ground glass as well.  Question viral process.   Assessment/Plan: Encounter Diagnoses  Name Primary?   Snoring Yes   Chronic nasal congestion    Environmental and seasonal allergies    Nasal obstruction    Hypertrophy of both inferior nasal turbinates    Nasal septal deviation    Mouth breathing    Obstructive sleep apnea     Assessment and Plan Assessment & Plan Chronic nasal congestion and Nasal obstruction mouth breathing Chronic nasal congestion/obstruction for several months, not on medications for it. She reports mouth breathing. Nasal endoscopy with septal deviation to the left ITH and severe mucosal edema. No polyps or pus noted - Prescribed Flonase  twice daily and Xyzal  5 mg QHS - Recommended nasal saline rinses with Neilmed bottle.  Septal deviation and ITH - medical management of nasal congestion - will consider septoplasty/ITR if sx persist  Suspected environmental allergies Suspected allergic rhinitis contributing to congestion. Allergy testing needed to assess for allergy shots. - referral to Allergy for testing and potential allergy shots   Suspected obstructive sleep apnea and snoring Exam with 2+ tonsils and Friedman IV tongue position.  We discussed that her sx and exam  are concerning for OSA. We will to order sleep study to evaluate - Ordered sleep study - referral to [facility]  RTC 3 mo  Thank you for allowing me to participate in the care of this patient. Please do not hesitate to contact me with any questions or concerns.    Elena Larry, MD [facility] Phone: [phone] Fax: [phone]    03/23/2024, 11:09 AM

## 2024-03-23 NOTE — Patient Instructions (Signed)
 Lloyd Huger Med Nasal Saline Rinse   - start nasal saline rinses with NeilMed Bottle available over the counter or online to help with nasal congestion

## 2024-04-08 ENCOUNTER — Other Ambulatory Visit (HOSPITAL_BASED_OUTPATIENT_CLINIC_OR_DEPARTMENT_OTHER): Payer: Self-pay

## 2024-04-08 MED ORDER — FLUZONE 0.5 ML IM SUSY
0.5000 mL | PREFILLED_SYRINGE | Freq: Once | INTRAMUSCULAR | 0 refills | Status: AC
  Filled 2024-04-08: qty 0.5, 1d supply, fill #0

## 2024-05-10 ENCOUNTER — Other Ambulatory Visit: Payer: Self-pay | Admitting: Medical Genetics

## 2024-05-10 DIAGNOSIS — Z006 Encounter for examination for normal comparison and control in clinical research program: Secondary | ICD-10-CM

## 2024-05-11 ENCOUNTER — Encounter: Payer: Self-pay | Admitting: Allergy & Immunology

## 2024-05-11 ENCOUNTER — Ambulatory Visit: Admitting: Allergy & Immunology

## 2024-05-11 ENCOUNTER — Other Ambulatory Visit: Payer: Self-pay

## 2024-05-11 VITALS — BP 130/86 | HR 101 | Temp 98.4°F | Resp 16 | Ht 67.75 in | Wt 209.9 lb

## 2024-05-11 DIAGNOSIS — J310 Chronic rhinitis: Secondary | ICD-10-CM

## 2024-05-11 DIAGNOSIS — R0683 Snoring: Secondary | ICD-10-CM | POA: Diagnosis not present

## 2024-05-11 DIAGNOSIS — R21 Rash and other nonspecific skin eruption: Secondary | ICD-10-CM | POA: Diagnosis not present

## 2024-05-11 NOTE — Patient Instructions (Addendum)
 1. Chronic rhinitis - Because of insurance stipulations, we cannot do skin testing on the same day as your first visit. - We are all working to fight this, but for now we need to do two separate visits.  - We will know more after we do testing at the next visit.  - The skin testing visit can be squeezed in at your convenience.  - Then we can make a more full plan to address all of your symptoms. - Be sure to stop your antihistamines for 3 days before this appointment.   2. Snoring - Hopefully the testing will reveal some allergies that we can address.  - We could consider a sleep study if needed.   3. Return in about 1 week (around 05/18/2024) for ALLERGY TESTING (1-55). You can have the follow up appointment with Dr. Iva or a Nurse Practicioner (our Nurse Practitioners are excellent and always have Physician oversight!).    Please inform us  of any Emergency Department visits, hospitalizations, or changes in symptoms. Call us  before going to the ED for breathing or allergy symptoms since we might be able to fit you in for a sick visit. Feel free to contact us  anytime with any questions, problems, or concerns.  It was a pleasure to meet you today!  Websites that have reliable patient information: 1. American Academy of Asthma, Allergy, and Immunology: www.aaaai.org 2. Food Allergy Research and Education (FARE): foodallergy.org 3. Mothers of Asthmatics: http://www.asthmacommunitynetwork.org 4. American College of Allergy, Asthma, and Immunology: www.acaai.org      "Like" us  on Facebook and Instagram for our latest updates!      A healthy democracy works best when [employer] participate! Make sure you are registered to vote! If you have moved or changed any of your contact information, you will need to get this updated before voting! Scan the QR codes below to learn more!          No as such as we

## 2024-05-11 NOTE — Progress Notes (Signed)
 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 Chronic rhinitis - Because of insurance stipulations, we cannot do skin testing on the same day as your first visit. - We are all working to fight this, but for now we need to do two separate visits.  - We will know more after we do testing at the next visit.  - The skin testing visit can be squeezed in at your convenience.  - Then we can make a more full plan to address all of your symptoms. - Be sure to stop your antihistamines for 3 days before this appointment.   2. Snoring - Hopefully the testing will reveal some allergies that we can address.  - We could consider a sleep study if needed.   3. Return in about 1 week (around 05/18/2024) for ALLERGY TESTING (1-55). You can have the follow up appointment with Dr. Iva or a Nurse Practicioner (our Nurse Practitioners are excellent and always have Physician oversight!).   This note in its entirety was forwarded to the Provider who requested this consultation.  Subjective:   Melissa Page is a 23 y.o. female presenting today for evaluation of  Chief Complaint  Patient presents with   Establish Care    She referred by primary care to check any allergies- stuffy/congestion nose every lay down.      Melissa Page has a history of the following: Patient Active Problem List   Diagnosis Date Noted   Chronic constipation     History obtained from: chart review and patient.  Discussed the use of AI scribe software for clinical note transcription with the patient and/or guardian, who gave verbal consent to proceed.  Melissa Page was referred by Chrystal Lamarr RAMAN, MD.     Melissa Page is a 23 y.o. female presenting for an evaluation of environmental allergies.   Allergic Rhinitis Symptom History: She has been snoring for months now. She had a similar problems 8-9 years ago. She is on Flonase  twice daily which is helping. She was also started on leovcetirizine for her allergies. This is helping a bit, but wears off over the course of the day. She has been experiencing heavy snoring and nasal obstruction for the past few months, which is unusual for her. She recalls a similar issue during childhood, managed with nasal spray, which resolved without surgery. Recently, an ENT specialist identified inflammation and a deviated septum. She has been using Flonase  nasal spray and oral tablets, which provide some relief, but she still struggles with nasal breathing, especially as the day progresses.  Skin Symptom History: She experiences eczema, particularly in winter, occurring approximately every five years. She also reports a rash in the summer when exposed to direct sunlight for extended periods, causing her lips to swell. This condition has not been diagnosed despite previous dermatology consultations.  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 The infection was resistant to multiple antibiotics, including vancomycin , and only resolved after the transplant. Post-transplant, she took probiotics to aid recovery. She has not  needed antibiotics since that time. No asthma or frequent sinus infections.   She lives at home with her family during her gap year after graduating from college. 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 She has two brothers and one sister, with her oldest brother living in [place]  and the rest of her family in [place] .   Otherwise, there  is no history of other atopic diseases, including asthma, drug allergies, stinging insect allergies, or contact dermatitis. There is no significant infectious history. Vaccinations are up to date.    Past Medical History: Patient Active Problem List   Diagnosis Date Noted   Chronic constipation     Medication List:  Allergies as of 05/11/2024       Reactions   Amoxicillin Hives        Medication List        Accurate as of May 11, 2024 11:56 AM. If you have any questions, ask your nurse or doctor.          fluticasone  50 MCG/ACT nasal spray Commonly known as: FLONASE  Place 2 sprays into both nostrils daily.   gabapentin  300 MG capsule Commonly known as: Neurontin  Take 1 capsule (300 mg total) by mouth 3 (three) times daily.   levocetirizine 5 MG tablet Commonly known as: Xyzal  Allergy 24HR Take 1 tablet (5 mg total) by mouth every evening.   Sprintec 28 0.25-35 MG-MCG tablet Generic drug: norgestimate-ethinyl estradiol Take 1 tablet by mouth daily.        Birth History: non-contributory  Developmental History: non-contributory  Past Surgical History: Past Surgical History:  Procedure Laterality Date   FECAL TRANSPLANT     HYMENECTOMY  06/11/2012   Procedure: HYMENECTOMY;  Surgeon: Burnard VEAR Pate, MD;  Location: [facility];  Service: Gynecology;  Laterality: N/A;     Family History: Family History  Problem Relation Age of Onset   Asthma Sister    Asthma Other    Hypertension Other    Hirschsprung's disease Neg Hx    Stomach cancer Neg Hx    Pancreatic cancer Neg Hx    Colon cancer Neg Hx    Esophageal cancer Neg Hx      Social History: Cora lives at home with her family.  She lives in a house.  There is tile and carpeting throughout the home.  They have electric heating and central cooling.  There are no dust mite covers in the bedding.  There is no tobacco exposure.  She currently works as a Water engineer for the past 2 years.  There  are no fume, chemical, or dust exposures.  She does not live near an interstate or industrial area.    Review of systems otherwise negative other than that mentioned in the HPI.    Objective:   Blood pressure 130/86, pulse (!) 101, temperature 98.4 F (36.9 C), temperature source Temporal, resp. rate 16, height 5' 7.75 (1.721 m), weight 209 lb 14.4 oz (95.2 kg), SpO2 99%. Body mass index is 32.15 kg/m.     Physical Exam Vitals reviewed.  Constitutional:      Appearance: She is well-developed.     Comments: Very pleasant. Cooperative with the exam.   HENT:     Head: Normocephalic and atraumatic.     Right Ear: Tympanic membrane, ear canal and external ear normal. No drainage, swelling or tenderness. Tympanic membrane is not injected, scarred, erythematous, retracted or bulging.     Left Ear: Tympanic membrane,  ear canal and external ear normal. No drainage, swelling or tenderness. Tympanic membrane is not injected, scarred, erythematous, retracted or bulging.     Nose: No nasal deformity, septal deviation, mucosal edema or rhinorrhea.     Right Sinus: No maxillary sinus tenderness or frontal sinus tenderness.     Left Sinus: No maxillary sinus tenderness or frontal sinus tenderness.     Mouth/Throat:     Mouth: Mucous membranes are not pale and not dry.     Pharynx: Uvula midline.  Eyes:     General:        Right eye: No discharge.        Left eye: No discharge.     Conjunctiva/sclera: Conjunctivae normal.     Right eye: Right conjunctiva is not injected. No chemosis.    Left eye: Left conjunctiva is not injected. No chemosis.    Pupils: Pupils are equal, round, and reactive to light.  Cardiovascular:     Rate and Rhythm: Normal rate and regular rhythm.     Heart sounds: Normal heart sounds.  Pulmonary:     Effort: Pulmonary effort is normal. No tachypnea, accessory muscle usage or respiratory distress.     Breath sounds: Normal breath sounds. No wheezing, rhonchi or  rales.  Chest:     Chest wall: No tenderness.  Abdominal:     Tenderness: There is no abdominal tenderness. There is no guarding or rebound.  Lymphadenopathy:     Head:     Right side of head: No submandibular, tonsillar or occipital adenopathy.     Left side of head: No submandibular, tonsillar or occipital adenopathy.     Cervical: No cervical adenopathy.  Skin:    Coloration: Skin is not pale.     Findings: No abrasion, erythema, petechiae or rash. Rash is not papular, urticarial or vesicular.  Neurological:     Mental Status: She is alert.  Psychiatric:        Behavior: Behavior is cooperative.      Diagnostic studies: deferred due to insurance stipulations that require a separate visit for testing          Marty Shaggy, MD [facility] 

## 2024-05-18 ENCOUNTER — Ambulatory Visit (INDEPENDENT_AMBULATORY_CARE_PROVIDER_SITE_OTHER): Admitting: Pulmonary Disease

## 2024-05-18 ENCOUNTER — Encounter: Payer: Self-pay | Admitting: Pulmonary Disease

## 2024-05-18 VITALS — BP 123/83 | HR 87 | Temp 97.9°F | Ht 67.0 in | Wt 211.0 lb

## 2024-05-18 DIAGNOSIS — E66811 Obesity, class 1: Secondary | ICD-10-CM

## 2024-05-18 DIAGNOSIS — R0683 Snoring: Secondary | ICD-10-CM | POA: Diagnosis not present

## 2024-05-18 NOTE — Progress Notes (Signed)
 Melissa Page    [id number]    October 01, 2000  Primary Care Physician:Wharton, Charmaine RIGGERS  Referring Physician: Soldatova, Liuba, MD [address] [place],  [place] [address]  Chief complaint:   Patient being seen for snoring  HPI:  Patient being seen for snoring Concern for obstructive sleep apnea Recently saw Dr. Soldotova-ENT for nasal stuffiness and congestion - Being treated for rhinitis  There was concern during eye exam about sleep disordered breathing  Patient states she feels well with her sleep and she feels she is well rested outperform snoring Has never been told about holding her breath Usually goes to bed between 10 and 11 falls asleep in about 5 minutes Usually does not wake up except to use the bathroom on occasions Wakes up at 6 AM Wakes up feeling like she is rested well on most days  Denies any dryness of the mouth in the morning No morning headaches No night sweats No family history of obstructive sleep apnea Never smoker  No memory issues  Feels well  Outpatient Encounter Medications as of 05/18/2024  Medication Sig   fluticasone  (FLONASE ) 50 MCG/ACT nasal spray Place 2 sprays into both nostrils daily.   levocetirizine (XYZAL  ALLERGY 24HR) 5 MG tablet Take 1 tablet (5 mg total) by mouth every evening.   SPRINTEC 28 0.25-35 MG-MCG tablet Take 1 tablet by mouth daily. (Patient not taking: Reported on 05/18/2024)   No facility-administered encounter medications on file as of 05/18/2024.    Allergies as of 05/18/2024 - Review Complete 05/18/2024  Allergen Reaction Noted   Amoxicillin Hives 03/17/2021    Past Medical History:  Diagnosis Date   C. difficile colitis    Constipation     Past Surgical History:  Procedure Laterality Date   FECAL TRANSPLANT     HYMENECTOMY  06/11/2012   Procedure: HYMENECTOMY;  Surgeon: Burnard VEAR Pate, MD;  Location: [facility];  Service: Gynecology;  Laterality: N/A;    Family History  Problem  Relation Age of Onset   Asthma Sister    Asthma Other    Hypertension Other    Hirschsprung's disease Neg Hx    Stomach cancer Neg Hx    Pancreatic cancer Neg Hx    Colon cancer Neg Hx    Esophageal cancer Neg Hx     Social History   Socioeconomic History   Marital status: Single    Spouse name: Not on file   Number of children: Not on file   Years of education: Not on file   Highest education level: Not on file  Occupational History   Not on file  Tobacco Use   Smoking status: Never   Smokeless tobacco: Never  Vaping Use   Vaping status: Never Used  Substance and Sexual Activity   Alcohol use: No   Drug use: No   Sexual activity: Never  Other Topics Concern   Not on file  Social History Narrative   Not on file   Social Drivers of Health   Financial Resource Strain: Not on file  Food Insecurity: Not on file  Transportation Needs: Not on file  Physical Activity: Not on file  Stress: Not on file  Social Connections: Not on file  Intimate Partner Violence: Not on file    Review of Systems  Respiratory:  Negative for shortness of breath.   Psychiatric/Behavioral:  Negative for sleep disturbance.     Vitals:   05/18/24 0822  BP: 123/83  Pulse:  87  Temp: 97.9 F (36.6 C)  SpO2: 98%     Physical Exam Constitutional:      Appearance: She is obese.  HENT:     Head: Normocephalic.     Mouth/Throat:     Mouth: Mucous membranes are moist.     Comments: Crowded oropharynx, Mallampati 3 Eyes:     General: No scleral icterus.    Pupils: Pupils are equal, round, and reactive to light.  Cardiovascular:     Rate and Rhythm: Normal rate and regular rhythm.     Heart sounds: No murmur heard.    No friction rub.  Pulmonary:     Effort: No respiratory distress.     Breath sounds: No stridor. No wheezing or rhonchi.  Musculoskeletal:     Cervical back: No rigidity or tenderness.  Neurological:     Mental Status: She is alert.  Psychiatric:        Mood and  Affect: Mood normal.     Data Reviewed: Records from Dr. Jackie office visit reviewed  Assessment/Plan: Snoring  Concern for obstructive sleep apnea  Patient does not appear concerned with asleep at the present time Wakes up feeling well Not sleepy during the day, not feeling fatigued during the day  Class I obesity -She is working on weight  Pathophysiology of sleep disordered breathing discussed Risk with sleep apnea discussed  She does not appear to have any other significant concerning symptoms to suggest she has significant sleep disordered breathing  Discussed weight management  Positional changes that reduces snoring will include elevation of the head of the bed, side sleeping which may both help snoring  Risk of significant sleep disordered breathing is low Follow-up only as needed  Jennet Epley MD [facility] 05/18/2024, 8:43 AM  CC: Okey Burns, MD

## 2024-05-18 NOTE — Patient Instructions (Signed)
 Your risk for sleep apnea is low  To help snoring, sleep on your sides, elevation of the head of bed may also help snoring  Call us  with significant concerns  Continue to manage symptoms of your nasal stuffiness and congestion   No need for follow-up visit at present You may call us  with any significant concerns

## 2024-05-25 ENCOUNTER — Encounter: Payer: Self-pay | Admitting: Allergy & Immunology

## 2024-05-25 ENCOUNTER — Ambulatory Visit: Admitting: Allergy & Immunology

## 2024-05-25 VITALS — BP 110/64 | Temp 98.3°F | Ht 67.0 in

## 2024-05-25 DIAGNOSIS — J3089 Other allergic rhinitis: Secondary | ICD-10-CM

## 2024-05-25 DIAGNOSIS — J302 Other seasonal allergic rhinitis: Secondary | ICD-10-CM

## 2024-05-25 MED ORDER — LEVOCETIRIZINE DIHYDROCHLORIDE 5 MG PO TABS: 5.0000 mg | ORAL_TABLET | Freq: Every evening | ORAL | 1 refills | Status: AC

## 2024-05-25 MED ORDER — FLUTICASONE PROPIONATE 50 MCG/ACT NA SUSP: 2.0000 | Freq: Every day | NASAL | 5 refills | Status: AC

## 2024-05-25 MED ORDER — MONTELUKAST SODIUM 10 MG PO TABS: 10.0000 mg | ORAL_TABLET | Freq: Every day | ORAL | 1 refills | Status: AC

## 2024-05-25 NOTE — Addendum Note (Signed)
 Addended by: JENEL MARYLYNN GRADE on: 05/25/2024 04:43 PM   Modules accepted: Orders

## 2024-05-25 NOTE — Patient Instructions (Addendum)
 1. Chronic rhinitis - Testing today showed: grasses, weeds, trees, indoor molds, outdoor molds, dust mites, and dog - Copy of test results provided.  - Avoidance measures provided. - Try dust mite covers for the pillows at LEAST - Continue with: Xyzal  (levocetirizine) 5mg  tablet once daily and Flonase  (fluticasone ) two sprays per nostril TWICE DAILY (AIM FOR EAR ON EACH SIDE) - Start taking: Singulair (montelukast) 10mg  daily - This can cause irritability and bad dreams, so beware of this rare side effect!  - You can use an extra dose of the antihistamine, if needed, for breakthrough symptoms.  - Consider nasal saline rinses 1-2 times daily to remove allergens from the nasal cavities as well as help with mucous clearance (this is especially helpful to do before the nasal sprays are given) - Consider allergy shots as a means of long-term control. - Allergy shots re-train and reset the immune system to ignore environmental allergens and decrease the resulting immune response to those allergens (sneezing, itchy watery eyes, runny nose, nasal congestion, etc).    - Allergy shots improve symptoms in 75-85% of patients.  - We can discuss more at the next appointment if the medications are not working for you.  2. Snoring - The Singulair might help with this. - Let's see if we can get the snoring under better control with the use of the allergy medications.  - We could consider a sleep study if needed.   3. Return in about 2 months (around 07/25/2024). You can have the follow up appointment with Dr. Iva or a Nurse Practicioner (our Nurse Practitioners are excellent and always have Physician oversight!).    Please inform us  of any Emergency Department visits, hospitalizations, or changes in symptoms. Call us  before going to the ED for breathing or allergy symptoms since we might be able to fit you in for a sick visit. Feel free to contact us  anytime with any questions, problems, or  concerns.  It was a pleasure to see you again today!  Websites that have reliable patient information: 1. American Academy of Asthma, Allergy, and Immunology: www.aaaai.org 2. Food Allergy Research and Education (FARE): foodallergy.org 3. Mothers of Asthmatics: http://www.asthmacommunitynetwork.org 4. American College of Allergy, Asthma, and Immunology: www.acaai.org      "Like" us  on Facebook and Instagram for our latest updates!      A healthy democracy works best when [employer] participate! Make sure you are registered to vote! If you have moved or changed any of your contact information, you will need to get this updated before voting! Scan the QR codes below to learn more!        Airborne Adult Perc - 05/25/24 1100     Time Antigen Placed 0940    Allergen Manufacturer Jestine    Location Back    Number of Test 55    1. Control-Buffer 50% Glycerol Negative    2. Control-Histamine 2+    3. Bahia 2+    4. Bermuda Negative    5. Johnson Negative    6. Kentucky  Blue Negative    7. Meadow Fescue Negative    8. Perennial Rye Negative    9. Timothy Negative    10. Ragweed Mix Negative    11. Cocklebur Negative    12. Plantain,  English 2+    13. Baccharis Negative    14. Dog Fennel Negative    15. Russian Thistle Negative    16. Lamb's Quarters Negative    17. Sheep Sorrell Negative  18. Rough Pigweed Negative    19. Marsh Elder, Rough Negative    20. Mugwort, Common Negative    21. Box, Elder Negative    22. Cedar, red Negative    23. Sweet Gum Negative    24. Pecan Pollen Negative    25. Pine Mix Negative    26. Walnut, Black Pollen Negative    27. Red Mulberry Negative    28. Ash Mix Negative    29. Birch Mix Negative    30. Beech American Negative    31. Cottonwood, Eastern Negative    32. Hickory, White --   +/-   33. Maple Mix Negative    34. Oak, Eastern Mix Negative    35. Sycamore Eastern Negative    36. Alternaria Alternata Negative    37.  Cladosporium Herbarum Negative    38. Aspergillus Mix Negative    39. Penicillium Mix Negative    40. Bipolaris Sorokiniana (Helminthosporium) Negative    41. Drechslera Spicifera (Curvularia) Negative    42. Mucor Plumbeus Negative    43. Fusarium Moniliforme Negative    44. Aureobasidium Pullulans (pullulara) Negative    45. Rhizopus Oryzae Negative    46. Botrytis Cinera Negative    47. Epicoccum Nigrum Negative    48. Phoma Betae Negative    49. Dust Mite Mix 2+    50. Cat Hair 10,000 BAU/ml Negative    51.  Dog Epithelia 2+    52. Mixed Feathers Negative    53. Horse Epithelia Negative    54. Cockroach, German Negative    55. Tobacco Leaf Negative          Intradermal - 05/25/24 1017     Time Antigen Placed 1015    Allergen Manufacturer Jestine    Location Arm    Number of Test 13    Control Negative    Bermuda Negative    Johnson Negative    7 Grass Negative    Ragweed Mix Negative    Weed Mix Negative    Tree Mix 3+    Mold 1 1+    Mold 2 1+    Mold 3 2+    Mold 4 Negative    Cat Negative    Cockroach Negative           Airborne Adult Perc - 05/25/24 1100     Time Antigen Placed 0940    Allergen Manufacturer Greer    Location Back    Number of Test 55    1. Control-Buffer 50% Glycerol Negative    2. Control-Histamine 2+    3. Bahia 2+    4. Bermuda Negative    5. Johnson Negative    6. Kentucky  Blue Negative    7. Meadow Fescue Negative    8. Perennial Rye Negative    9. Timothy Negative    10. Ragweed Mix Negative    11. Cocklebur Negative    12. Plantain,  English 2+    13. Baccharis Negative    14. Dog Fennel Negative    15. Russian Thistle Negative    16. Lamb's Quarters Negative    17. Sheep Sorrell Negative    18. Rough Pigweed Negative    19. Marsh Elder, Rough Negative    20. Mugwort, Common Negative    21. Box, Elder Negative    22. Cedar, red Negative    23. Sweet Gum Negative    24. Pecan Pollen Negative    25. Pine Mix  Negative    26. Walnut, Black Pollen Negative    27. Red Mulberry Negative    28. Ash Mix Negative    29. Birch Mix Negative    30. Beech American Negative    31. Cottonwood, Eastern Negative    32. Hickory, White --   +/-   33. Maple Mix Negative    34. Oak, Eastern Mix Negative    35. Sycamore Eastern Negative    36. Alternaria Alternata Negative    37. Cladosporium Herbarum Negative    38. Aspergillus Mix Negative    39. Penicillium Mix Negative    40. Bipolaris Sorokiniana (Helminthosporium) Negative    41. Drechslera Spicifera (Curvularia) Negative    42. Mucor Plumbeus Negative    43. Fusarium Moniliforme Negative    44. Aureobasidium Pullulans (pullulara) Negative    45. Rhizopus Oryzae Negative    46. Botrytis Cinera Negative    47. Epicoccum Nigrum Negative    48. Phoma Betae Negative    49. Dust Mite Mix 2+    50. Cat Hair 10,000 BAU/ml Negative    51.  Dog Epithelia 2+    52. Mixed Feathers Negative    53. Horse Epithelia Negative    54. Cockroach, German Negative    55. Tobacco Leaf Negative          Intradermal - 05/25/24 1017     Time Antigen Placed 1015    Allergen Manufacturer Jestine    Location Arm    Number of Test 13    Control Negative    Bermuda Negative    Johnson Negative    7 Grass Negative    Ragweed Mix Negative    Weed Mix Negative    Tree Mix 3+    Mold 1 1+    Mold 2 1+    Mold 3 2+    Mold 4 Negative    Cat Negative    Cockroach Negative          Reducing Pollen Exposure  The American Academy of Allergy, Asthma and Immunology suggests the following steps to reduce your exposure to pollen during allergy seasons.    Do not hang sheets or clothing out to dry; pollen may collect on these items. Do not mow lawns or spend time around freshly cut grass; mowing stirs up pollen. Keep windows closed at night.  Keep car windows closed while driving. Minimize morning activities outdoors, a time when pollen counts are usually at their  highest. Stay indoors as much as possible when pollen counts or humidity is high and on windy days when pollen tends to remain in the air longer. Use air conditioning when possible.  Many air conditioners have filters that trap the pollen spores. Use a HEPA room air filter to remove pollen form the indoor air you breathe.  Control of Mold Allergen   Mold and fungi can grow on a variety of surfaces provided certain temperature and moisture conditions exist.  Outdoor molds grow on plants, decaying vegetation and soil.  The major outdoor mold, Alternaria and Cladosporium, are found in very high numbers during hot and dry conditions.  Generally, a late Summer - Fall peak is seen for common outdoor fungal spores.  Rain will temporarily lower outdoor mold spore count, but counts rise rapidly when the rainy period ends.  The most important indoor molds are Aspergillus and Penicillium.  Dark, humid and poorly ventilated basements are ideal sites for mold growth.  The next most common sites  of mold growth are the bathroom and the kitchen.  Outdoor (Seasonal) Mold Control  Positive outdoor molds via skin testing: Alternaria, Cladosporium, Bipolaris (Helminthsporium), Drechslera (Curvalaria), and Mucor  Use air conditioning and keep windows closed Avoid exposure to decaying vegetation. Avoid leaf raking. Avoid grain handling. Consider wearing a face mask if working in moldy areas.    Indoor (Perennial) Mold Control   Positive indoor molds via skin testing: Aspergillus and Penicillium  Maintain humidity below 50%. Clean washable surfaces with 5% bleach solution. Remove sources e.g. contaminated carpets.    Control of Dust Mite Allergen    Dust mites play a major role in allergic asthma and rhinitis.  They occur in environments with high humidity wherever human skin is found.  Dust mites absorb humidity from the atmosphere (ie, they do not drink) and feed on organic matter (including shed human  and animal skin).  Dust mites are a microscopic type of insect that you cannot see with the naked eye.  High levels of dust mites have been detected from mattresses, pillows, carpets, upholstered furniture, bed covers, clothes, soft toys and any woven material.  The principal allergen of the dust mite is found in its feces.  A gram of dust may contain 1,000 mites and 250,000 fecal particles.  Mite antigen is easily measured in the air during house cleaning activities.  Dust mites do not bite and do not cause harm to humans, other than by triggering allergies/asthma.    Ways to decrease your exposure to dust mites in your home:  Encase mattresses, box springs and pillows with a mite-impermeable barrier or cover   Wash sheets, blankets and drapes weekly in hot water (130 F) with detergent and dry them in a dryer on the hot setting.  Have the room cleaned frequently with a vacuum cleaner and a damp dust-mop.  For carpeting or rugs, vacuuming with a vacuum cleaner equipped with a high-efficiency particulate air (HEPA) filter.  The dust mite allergic individual should not be in a room which is being cleaned and should wait 1 hour after cleaning before going into the room. Do not sleep on upholstered furniture (eg, couches).   If possible removing carpeting, upholstered furniture and drapery from the home is ideal.  Horizontal blinds should be eliminated in the rooms where the person spends the most time (bedroom, study, television room).  Washable vinyl, roller-type shades are optimal. Remove all non-washable stuffed toys from the bedroom.  Wash stuffed toys weekly like sheets and blankets above.   Reduce indoor humidity to less than 50%.  Inexpensive humidity monitors can be purchased at most hardware stores.  Do not use a humidifier as can make the problem worse and are not recommended.  Allergy Shots  Allergies are the result of a chain reaction that starts in the immune system. Your immune system  controls how your body defends itself. For instance, if you have an allergy to pollen, your immune system identifies pollen as an invader or allergen. Your immune system overreacts by producing antibodies called Immunoglobulin E (IgE). These antibodies travel to cells that release chemicals, causing an allergic reaction.  The concept behind allergy immunotherapy, whether it is received in the form of shots or tablets, is that the immune system can be desensitized to specific allergens that trigger allergy symptoms. Although it requires time and patience, the payback can be long-term relief. Allergy injections contain a dilute solution of those substances that you are allergic to based upon your skin testing  and allergy history.   How Do Allergy Shots Work?  Allergy shots work much like a vaccine. Your body responds to injected amounts of a particular allergen given in increasing doses, eventually developing a resistance and tolerance to it. Allergy shots can lead to decreased, minimal or no allergy symptoms.  There generally are two phases: build-up and maintenance. Build-up often ranges from three to six months and involves receiving injections with increasing amounts of the allergens. The shots are typically given once or twice a week, though more rapid build-up schedules are sometimes used.  The maintenance phase begins when the most effective dose is reached. This dose is different for each person, depending on how allergic you are and your response to the build-up injections. Once the maintenance dose is reached, there are longer periods between injections, typically two to four weeks.  Occasionally doctors give cortisone-type shots that can temporarily reduce allergy symptoms. These types of shots are different and should not be confused with allergy immunotherapy shots.  Who Can Be Treated with Allergy Shots?  Allergy shots may be a good treatment approach for people with allergic rhinitis  (hay fever), allergic asthma, conjunctivitis (eye allergy) or stinging insect allergy.   Before deciding to begin allergy shots, you should consider:   The length of allergy season and the severity of your symptoms  Whether medications and/or changes to your environment can control your symptoms  Your desire to avoid long-term medication use  Time: allergy immunotherapy requires a major time commitment  Cost: may vary depending on your insurance coverage  Allergy shots for children age 61 and older are effective and often well tolerated. They might prevent the onset of new allergen sensitivities or the progression to asthma.  Allergy shots are not started on patients who are pregnant but can be continued on patients who become pregnant while receiving them. In some patients with other medical conditions or who take certain common medications, allergy shots may be of risk. It is important to mention other medications you talk to your allergist.   What are the two types of build-ups offered:   RUSH or Rapid Desensitization -- one day of injections lasting from 8:30-4:30pm, injections every 1 hour.  Approximately half of the build-up process is completed in that one day.  The following week, normal build-up is resumed, and this entails ~16 visits either weekly or twice weekly, until reaching your "maintenance dose" which is continued weekly until eventually getting spaced out to every month for a duration of 3 to 5 years. The regular build-up appointments are nurse visits where the injections are administered, followed by required monitoring for 30 minutes.    Traditional build-up -- weekly visits for 6 -12 months until reaching "maintenance dose", then continue weekly until eventually spacing out to every 4 weeks as above. At these appointments, the injections are administered, followed by required monitoring for 30 minutes.     Either way is acceptable, and both are equally effective. With the  rush protocol, the advantage is that less time is spent here for injections overall AND you would also reach maintenance dosing faster (which is when the clinical benefit starts to become more apparent). Not everyone is a candidate for rapid desensitization.   IF we proceed with the RUSH protocol, there are premedications which must be taken the day before and the day after the rush only (this includes antihistamines, steroids, and Singulair).  After the rush day, no prednisone or Singulair is required, and we just  recommend antihistamines taken on your injection day.  What Is An Estimate of the Costs?  If you are interested in starting allergy injections, please check with your insurance company about your coverage for both allergy vial sets and allergy injections.  Please do so prior to making the appointment to start injections.  The following are CPT codes to give to your insurance company. These are the amounts we BILL to the insurance company, but the amount YOU WILL PAY and WE RECEIVE IS SUBSTANTIALLY LESS and depends on the contracts we have with different insurance companies.   Amount Billed to Insurance One allergy vial set  CPT 95165   $ 1200     Two allergy vial set  CPT 95165   $ 2400     Three allergy vial set  CPT 95165   $ 3600     One injection   CPT 95115   $ 35  Two injections   CPT 95117   $ 40 RUSH (Rapid Desensitization) CPT 95180 x 8 hours $500/hour  Regarding the allergy injections, your co-pay may or may not apply with each injection, so please confirm this with your insurance company. When you start allergy injections, 1 or 2 sets of vials are made based on your allergies.  Not all patients can be on one set of vials. A set of vials lasts 6 months to a year depending on how quickly you can proceed with your build-up of your allergy injections. Vials are personalized for each patient depending on their specific allergens.  How often are allergy injection given during the  build-up period?   Injections are given at least weekly during the build-up period until your maintenance dose is achieved. Per the doctor's discretion, you may have the option of getting allergy injections two times per week during the build-up period. However, there must be at least 48 hours between injections. The build-up period is usually completed within 6-12 months depending on your ability to schedule injections and for adjustments for reactions. When maintenance dose is reached, your injection schedule is gradually changed to every two weeks and later to every three weeks. Injections will then continue every 4 weeks. Usually, injections are continued for a total of 3-5 years.   When Will I Feel Better?  Some may experience decreased allergy symptoms during the build-up phase. For others, it may take as long as 12 months on the maintenance dose. If there is no improvement after a year of maintenance, your allergist will discuss other treatment options with you.  If you aren't responding to allergy shots, it may be because there is not enough dose of the allergen in your vaccine or there are missing allergens that were not identified during your allergy testing. Other reasons could be that there are high levels of the allergen in your environment or major exposure to non-allergic triggers like tobacco smoke.  What Is the Length of Treatment?  Once the maintenance dose is reached, allergy shots are generally continued for three to five years. The decision to stop should be discussed with your allergist at that time. Some people may experience a permanent reduction of allergy symptoms. Others may relapse and a longer course of allergy shots can be considered.  What Are the Possible Reactions?  The two types of adverse reactions that can occur with allergy shots are local and systemic. Common local reactions include very mild redness and swelling at the injection site, which can happen  immediately or several hours  after. Report a delayed reaction from your last injection. These include arm swelling or runny nose, watery eyes or cough that occurs within 12-24 hours after injection. A systemic reaction, which is less common, affects the entire body or a particular body system. They are usually mild and typically respond quickly to medications. Signs include increased allergy symptoms such as sneezing, a stuffy nose or hives.   Rarely, a serious systemic reaction called anaphylaxis can develop. Symptoms include swelling in the throat, wheezing, a feeling of tightness in the chest, nausea or dizziness. Most serious systemic reactions develop within 30 minutes of allergy shots. This is why it is strongly recommended you wait in your doctor's office for 30 minutes after your injections. Your allergist is trained to watch for reactions, and his or her staff is trained and equipped with the proper medications to identify and treat them.   Report to the nurse immediately if you experience any of the following symptoms: swelling, itching or redness of the skin, hives, watery eyes/nose, breathing difficulty, excessive sneezing, coughing, stomach pain, diarrhea, or light headedness. These symptoms may occur within 15-20 minutes after injection and may require medication.   Who Should Administer Allergy Shots?  The preferred location for receiving shots is your prescribing allergist's office. Injections can sometimes be given at another facility where the physician and staff are trained to recognize and treat reactions, and have received instructions by your prescribing allergist.  What if I am late for an injection?   Injection dose will be adjusted depending upon how many days or weeks you are late for your injection.   What if I am sick?   Please report any illness to the nurse before receiving injections. She may adjust your dose or postpone injections depending on your symptoms. If you  have fever, flu, sinus infection or chest congestion it is best to postpone allergy injections until you are better. Never get an allergy injection if your asthma is causing you problems. If your symptoms persist, seek out medical care to get your health problem under control.  What If I am or Become Pregnant:  Women that become pregnant should schedule an appointment with [facility] before receiving any further allergy injections.

## 2024-05-25 NOTE — Progress Notes (Signed)
 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 Chronic rhinitis - Testing today showed: grasses, weeds, trees, indoor molds, outdoor molds, dust mites, and dog - Copy of test results provided.  - Avoidance measures provided. - Try dust mite covers for the pillows at LEAST - Continue with: Xyzal  (levocetirizine) 5mg  tablet once daily and Flonase  (fluticasone ) two sprays per nostril TWICE DAILY (AIM FOR EAR ON EACH SIDE) - Start taking: Singulair (montelukast) 10mg  daily - This can cause irritability and bad dreams, so beware of this rare side effect!  - You can use an extra dose of the antihistamine, if needed, for breakthrough symptoms.  - Consider nasal saline rinses 1-2 times daily to remove allergens from the nasal cavities as well as help with mucous clearance (this is especially helpful to do before the nasal sprays are given) - Consider allergy shots as a means of long-term control. - Allergy shots re-train and reset the immune system to ignore environmental allergens and decrease the resulting immune response to those allergens (sneezing, itchy watery eyes, runny nose, nasal congestion, etc).    - Allergy shots improve symptoms in 75-85% of patients.  - We can discuss more at the next appointment if the medications are not working for you.  2. Snoring - The Singulair might help with this. - Let's see if we can get the snoring under better control with the use of the allergy medications.  - We could consider a sleep study if needed.   3. Return in about 2 months (around 07/25/2024). You can have the  follow up appointment with Dr. Iva or a Nurse Practicioner (our Nurse Practitioners are excellent and always have Physician oversight!).    Subjective:   Melissa Page is a 23 y.o. female presenting today for follow up of  Chief Complaint  Patient presents with   Allergy Testing    Pt reports chronic sinusits and ENT recommends allergy testing    Melissa Page has a history of the following: Patient Active Problem List   Diagnosis Date Noted   Chronic constipation     History obtained from: chart review and patient.  Discussed the use of AI scribe software for clinical note transcription with the patient and/or guardian, who gave verbal consent to proceed.  Melissa Page is a 23 y.o. female presenting for skin testing. She was last seen on October 16th. We could not do testing because her insurance company does not cover testing on the same day as a New Patient visit. She has been off of all antihistamines 3 days in anticipation of the testing.   At the last visit, we decided to do environmental allergy testing.  She was having some snoring and we talked about whether allergies might be related to that.  We did talk about a sleep study as well.   Otherwise, there have been no changes to her past medical history, surgical history, family history, or social history.    Review of systems otherwise negative other than that mentioned in the HPI.    Objective:   Blood pressure 110/64, temperature 98.3  F (36.8 C), height 5' 7 (1.702 m). Body mass index is 33.05 kg/m.    Physical exam deferred since this was a skin testing appointment only.   Diagnostic studies:   Allergy Studies:     Airborne Adult Perc - 05/25/24 1100     Time Antigen Placed 0940    Allergen Manufacturer Jestine    Location Back    Number of Test 55    1. Control-Buffer 50% Glycerol Negative    2. Control-Histamine 2+    3. Bahia 2+    4. Bermuda Negative    5. Johnson Negative    6. [place]   Blue Negative    7. Meadow Fescue Negative    8. Perennial Rye Negative    9. Timothy Negative    10. Ragweed Mix Negative    11. Cocklebur Negative    12. Plantain,  English 2+    13. Baccharis Negative    14. Dog Fennel Negative    15. Russian Thistle Negative    16. Lamb's Quarters Negative    17. Sheep Sorrell Negative    18. Rough Pigweed Negative    19. Marsh Elder, Rough Negative    20. Mugwort, Common Negative    21. Box, Elder Negative    22. Cedar, red Negative    23. Sweet Gum Negative    24. Pecan Pollen Negative    25. Pine Mix Negative    26. Walnut, Black Pollen Negative    27. Red Mulberry Negative    28. Ash Mix Negative    29. Birch Mix Negative    30. Beech American Negative    31. Cottonwood, Eastern Negative    32. Hickory, White --   +/-   33. Maple Mix Negative    34. Oak, Eastern Mix Negative    35. Sycamore Eastern Negative    36. Alternaria Alternata Negative    37. Cladosporium Herbarum Negative    38. Aspergillus Mix Negative    39. Penicillium Mix Negative    40. Bipolaris Sorokiniana (Helminthosporium) Negative    41. Drechslera Spicifera (Curvularia) Negative    42. Mucor Plumbeus Negative    43. Fusarium Moniliforme Negative    44. Aureobasidium Pullulans (pullulara) Negative    45. Rhizopus Oryzae Negative    46. Botrytis Cinera Negative    47. Epicoccum Nigrum Negative    48. Phoma Betae Negative    49. Dust Mite Mix 2+    50. Cat Hair 10,000 BAU/ml Negative    51.  Dog Epithelia 2+    52. Mixed Feathers Negative    53. Horse Epithelia Negative    54. Cockroach, German Negative    55. Tobacco Leaf Negative          Intradermal - 05/25/24 1017     Time Antigen Placed 1015    Allergen Manufacturer Jestine    Location Arm    Number of Test 13    Control Negative    Bermuda Negative    Johnson Negative    7 Grass Negative    Ragweed Mix Negative    Weed Mix Negative    Tree Mix 3+    Mold 1 1+    Mold 2 1+    Mold 3 2+     Mold 4 Negative    Cat Negative    Cockroach Negative          Allergy testing results were read and interpreted by myself, documented by clinical staff.  Marty Shaggy, MD  [facility] 

## 2024-06-15 ENCOUNTER — Ambulatory Visit (INDEPENDENT_AMBULATORY_CARE_PROVIDER_SITE_OTHER)

## 2024-06-15 ENCOUNTER — Ambulatory Visit (INDEPENDENT_AMBULATORY_CARE_PROVIDER_SITE_OTHER): Admitting: Otolaryngology

## 2024-07-13 ENCOUNTER — Ambulatory Visit: Admitting: Allergy & Immunology
